# Patient Record
Sex: Female | Born: 1960 | Race: Black or African American | Hispanic: No | Marital: Single | Smoking: Never smoker
Health system: Southern US, Community
[De-identification: ages and names within clinical notes are randomized; demographics above are authoritative.]

## PROBLEM LIST (undated history)

## (undated) DIAGNOSIS — G4730 Sleep apnea, unspecified: Secondary | ICD-10-CM

## (undated) DIAGNOSIS — E7800 Pure hypercholesterolemia, unspecified: Secondary | ICD-10-CM

## (undated) DIAGNOSIS — M549 Dorsalgia, unspecified: Secondary | ICD-10-CM

## (undated) DIAGNOSIS — IMO0001 Reserved for inherently not codable concepts without codable children: Secondary | ICD-10-CM

## (undated) DIAGNOSIS — R112 Nausea with vomiting, unspecified: Secondary | ICD-10-CM

## (undated) DIAGNOSIS — K219 Gastro-esophageal reflux disease without esophagitis: Secondary | ICD-10-CM

## (undated) DIAGNOSIS — R42 Dizziness and giddiness: Secondary | ICD-10-CM

## (undated) DIAGNOSIS — F329 Major depressive disorder, single episode, unspecified: Secondary | ICD-10-CM

## (undated) DIAGNOSIS — R519 Headache, unspecified: Secondary | ICD-10-CM

## (undated) DIAGNOSIS — R51 Headache: Secondary | ICD-10-CM

## (undated) DIAGNOSIS — I8290 Acute embolism and thrombosis of unspecified vein: Secondary | ICD-10-CM

## (undated) DIAGNOSIS — Z9889 Other specified postprocedural states: Secondary | ICD-10-CM

## (undated) DIAGNOSIS — I10 Essential (primary) hypertension: Secondary | ICD-10-CM

## (undated) DIAGNOSIS — I739 Peripheral vascular disease, unspecified: Secondary | ICD-10-CM

## (undated) DIAGNOSIS — M519 Unspecified thoracic, thoracolumbar and lumbosacral intervertebral disc disorder: Secondary | ICD-10-CM

## (undated) DIAGNOSIS — G8929 Other chronic pain: Secondary | ICD-10-CM

## (undated) DIAGNOSIS — F32A Depression, unspecified: Secondary | ICD-10-CM

## (undated) HISTORY — PX: CHOLECYSTECTOMY: SHX55

## (undated) HISTORY — PX: DILATION AND CURETTAGE OF UTERUS: SHX78

---

## 1997-07-05 ENCOUNTER — Inpatient Hospital Stay (HOSPITAL_COMMUNITY): Admission: AD | Admit: 1997-07-05 | Discharge: 1997-07-05 | Payer: Self-pay | Admitting: Obstetrics

## 2000-06-05 ENCOUNTER — Encounter: Payer: Self-pay | Admitting: Obstetrics

## 2000-06-05 ENCOUNTER — Ambulatory Visit (HOSPITAL_COMMUNITY): Admission: RE | Admit: 2000-06-05 | Discharge: 2000-06-05 | Payer: Self-pay | Admitting: Obstetrics

## 2003-02-18 ENCOUNTER — Other Ambulatory Visit: Admission: RE | Admit: 2003-02-18 | Discharge: 2003-02-18 | Payer: Self-pay | Admitting: Family Medicine

## 2003-03-01 ENCOUNTER — Encounter: Admission: RE | Admit: 2003-03-01 | Discharge: 2003-03-01 | Payer: Self-pay | Admitting: Family Medicine

## 2004-01-18 ENCOUNTER — Ambulatory Visit: Payer: Self-pay | Admitting: Internal Medicine

## 2004-01-18 ENCOUNTER — Ambulatory Visit (HOSPITAL_BASED_OUTPATIENT_CLINIC_OR_DEPARTMENT_OTHER): Admission: RE | Admit: 2004-01-18 | Discharge: 2004-01-18 | Payer: Self-pay | Admitting: Family Medicine

## 2005-04-20 ENCOUNTER — Ambulatory Visit (HOSPITAL_COMMUNITY): Admission: RE | Admit: 2005-04-20 | Discharge: 2005-04-20 | Payer: Self-pay | Admitting: Obstetrics

## 2005-06-15 ENCOUNTER — Emergency Department (HOSPITAL_COMMUNITY): Admission: EM | Admit: 2005-06-15 | Discharge: 2005-06-16 | Payer: Self-pay | Admitting: Emergency Medicine

## 2005-06-16 ENCOUNTER — Ambulatory Visit (HOSPITAL_COMMUNITY): Admission: RE | Admit: 2005-06-16 | Discharge: 2005-06-16 | Payer: Self-pay | Admitting: *Deleted

## 2005-06-16 ENCOUNTER — Encounter: Payer: Self-pay | Admitting: Vascular Surgery

## 2005-06-22 ENCOUNTER — Ambulatory Visit (HOSPITAL_COMMUNITY): Admission: RE | Admit: 2005-06-22 | Discharge: 2005-06-22 | Payer: Self-pay | Admitting: Orthopedic Surgery

## 2006-07-25 ENCOUNTER — Ambulatory Visit (HOSPITAL_COMMUNITY): Admission: RE | Admit: 2006-07-25 | Discharge: 2006-07-25 | Payer: Self-pay | Admitting: Family Medicine

## 2007-06-06 ENCOUNTER — Encounter: Admission: RE | Admit: 2007-06-06 | Discharge: 2007-06-06 | Payer: Self-pay | Admitting: Family Medicine

## 2007-09-05 ENCOUNTER — Encounter: Admission: RE | Admit: 2007-09-05 | Discharge: 2007-09-05 | Payer: Self-pay | Admitting: Orthopedic Surgery

## 2007-11-04 ENCOUNTER — Encounter: Admission: RE | Admit: 2007-11-04 | Discharge: 2007-11-04 | Payer: Self-pay | Admitting: Orthopedic Surgery

## 2008-12-16 ENCOUNTER — Encounter: Admission: RE | Admit: 2008-12-16 | Discharge: 2008-12-16 | Payer: Self-pay | Admitting: Family Medicine

## 2009-03-31 ENCOUNTER — Encounter: Admission: RE | Admit: 2009-03-31 | Discharge: 2009-03-31 | Payer: Self-pay | Admitting: Family Medicine

## 2009-04-01 ENCOUNTER — Encounter: Admission: RE | Admit: 2009-04-01 | Discharge: 2009-04-01 | Payer: Self-pay | Admitting: Family Medicine

## 2010-02-05 ENCOUNTER — Encounter: Payer: Self-pay | Admitting: Obstetrics

## 2010-04-24 ENCOUNTER — Other Ambulatory Visit: Payer: Self-pay | Admitting: Family Medicine

## 2010-04-24 DIAGNOSIS — Z1231 Encounter for screening mammogram for malignant neoplasm of breast: Secondary | ICD-10-CM

## 2010-04-26 ENCOUNTER — Ambulatory Visit
Admission: RE | Admit: 2010-04-26 | Discharge: 2010-04-26 | Disposition: A | Discharge status: Home / Self Care | Payer: PRIVATE HEALTH INSURANCE | Source: Ambulatory Visit | Attending: Family Medicine | Admitting: Family Medicine

## 2010-04-26 DIAGNOSIS — Z1231 Encounter for screening mammogram for malignant neoplasm of breast: Secondary | ICD-10-CM

## 2010-06-02 NOTE — Procedures (Signed)
Kathy Houston, [place]              ACCOUNT NO.:  [id number]   MEDICAL RECORD NO.:  [id number]          PATIENT TYPE:  OUT   LOCATION:  SLEEP CENTER                 FACILITY:  [facility]   PHYSICIAN:  Clinton D. Annamaria Boots, M.D. DATE OF BIRTH:  1960-03-04   DATE OF STUDY:                              NOCTURNAL POLYSOMNOGRAM   STUDY DATE:  01/18/04   REFERRING PHYSICIAN:  Lucianne Lei, M.D.   INDICATION FOR STUDY:  Hypersomnia with sleep apnea.   EPWORTH SLEEPINESS SCORE:  15/24   SLEEP ARCHITECTURE:  Short total sleep time with sleep onset at 5:21 p.m.,  lights on at 11:32 p.m.  The schedule is apparently arranged to fit the  patient's usual sleep schedule although the patient provided little history.  Sleep efficiency was 40%.  Stage I 11% stage II 57%, stages III and IV 24%.  REM was 8% of total sleep time.  Sleep latency 10 minutes.  REM latency 41  minutes.  Awake after sleep onset 219 minutes.  Arousal index 21.  The  patient was noted talking on the telephone 2 hours into the sleep study and  was subsequent awake and restless most of the rest of the recording time.  No sleep medications were taken.  She used the television as a sleep aid.   Respiratory disturbance index (RDI) 20 per hour, indicating moderate  obstructive sleep apnea/hypopnea syndrome.  This reflected 3 obstructive  apneas and 47 hypopnea's.  Events were not positional.  REM RDI was 67 per  hour.  CPAP titration could not be performed on this study because of  insufficient sleep.   OXYGEN DATA:  Moderate snoring with oxygen desaturation to a nadir of 75%.  Mean oxygen saturations with the study was 96% on room air.   CARDIAC DATA:  Normal sinus rhythm   MOVEMENTS/PARASOMNIA:  Occasional leg jerk.  Bathroom x2.   IMPRESSION/RECOMMENDATION:  1.  Nonstandard sleep schedule, possibly reflecting job requirement, but      complicated by difficulty maintaining sleep and use of the telephone      during the sleep time.   These suggest poor sleep hygiene and invite      educational discussion with the patient about good sleep habits,      protected sleep environment and realistic expectations.  The insomnia      component may benefit substantially from appropriate use a sleep      medication.  2.  There is moderate sleep apnea/hypopnea with an RDI of 20 obstructive      events per hour and oxygen      desaturation to 75%.  Suggest return for CPAP titration bringing a sleep      medication, or evaluate for alternative therapies as appropriate.                                                           Clinton D. Annamaria Boots, M.D.  Diplomate, Tax adviser   CDY/MEDQ  D:  01/23/2004 10:09:48  T:  01/23/2004 12:23:47  Job:  [id number]

## 2010-09-27 ENCOUNTER — Other Ambulatory Visit: Payer: Self-pay | Admitting: Family Medicine

## 2010-09-27 ENCOUNTER — Ambulatory Visit
Admission: RE | Admit: 2010-09-27 | Discharge: 2010-09-27 | Disposition: A | Discharge status: Home / Self Care | Payer: PRIVATE HEALTH INSURANCE | Source: Ambulatory Visit | Attending: Family Medicine | Admitting: Family Medicine

## 2010-09-27 DIAGNOSIS — G8929 Other chronic pain: Secondary | ICD-10-CM

## 2011-07-30 DIAGNOSIS — I10 Essential (primary) hypertension: Secondary | ICD-10-CM | POA: Insufficient documentation

## 2011-07-30 DIAGNOSIS — E7800 Pure hypercholesterolemia, unspecified: Secondary | ICD-10-CM | POA: Insufficient documentation

## 2011-07-30 DIAGNOSIS — R42 Dizziness and giddiness: Secondary | ICD-10-CM | POA: Insufficient documentation

## 2011-07-30 DIAGNOSIS — G8929 Other chronic pain: Secondary | ICD-10-CM | POA: Insufficient documentation

## 2011-07-30 DIAGNOSIS — Z880 Allergy status to penicillin: Secondary | ICD-10-CM | POA: Insufficient documentation

## 2011-07-30 DIAGNOSIS — E119 Type 2 diabetes mellitus without complications: Secondary | ICD-10-CM | POA: Insufficient documentation

## 2011-07-30 DIAGNOSIS — M549 Dorsalgia, unspecified: Secondary | ICD-10-CM | POA: Insufficient documentation

## 2011-07-30 NOTE — ED Notes (Signed)
Pt is being tx for lower back pain x 3 months.  She states the pain is beginning to radiating to bil pos thighs.  States hydrocodone and tramadol are not working.

## 2011-07-31 ENCOUNTER — Emergency Department (HOSPITAL_COMMUNITY)
Admission: EM | Admit: 2011-07-31 | Discharge: 2011-07-31 | Disposition: A | Discharge status: Home / Self Care | Payer: Self-pay | Attending: Emergency Medicine | Admitting: Emergency Medicine

## 2011-07-31 ENCOUNTER — Encounter (HOSPITAL_COMMUNITY): Payer: Self-pay | Admitting: *Deleted

## 2011-07-31 DIAGNOSIS — M549 Dorsalgia, unspecified: Secondary | ICD-10-CM

## 2011-07-31 HISTORY — DX: Dizziness and giddiness: R42

## 2011-07-31 HISTORY — DX: Other chronic pain: G89.29

## 2011-07-31 HISTORY — DX: Essential (primary) hypertension: I10

## 2011-07-31 HISTORY — DX: Pure hypercholesterolemia, unspecified: E78.00

## 2011-07-31 HISTORY — DX: Dorsalgia, unspecified: M54.9

## 2011-07-31 MED ORDER — DIAZEPAM 5 MG PO TABS: 5.0000 mg | ORAL_TABLET | Freq: Three times a day (TID) | ORAL | Status: AC | PRN

## 2011-07-31 MED ORDER — OXYCODONE-ACETAMINOPHEN 5-325 MG PO TABS
1.0000 | ORAL_TABLET | Freq: Once | ORAL | Status: AC
  Administered 2011-07-31: 1 via ORAL
  Filled 2011-07-31: qty 1

## 2011-07-31 MED ORDER — PERCOCET 5-325 MG PO TABS: 1.0000 | ORAL_TABLET | ORAL | Status: AC | PRN

## 2011-07-31 NOTE — ED Provider Notes (Signed)
History     CSN: [id number]  Arrival date & time 07/30/11  2340   First MD Initiated Contact with Patient 07/31/11 0200      Chief Complaint  Patient presents with  . Back Pain    (Consider location/radiation/quality/duration/timing/severity/associated sxs/prior treatment) HPI Comments: Patient is a 51 year-old female with a history of neuropathy and chronic back pain. States that her lower back pain has been getting progressively worse over the past three months.  Notes it radiates down the back of her legs. The pain is constant, sharp and a 10 out of 10 on the pain scale.  Pain is exacerbated by standing and walking, worse first thing in the morning. She has not had any relief from hydrocodone, tramadol, and ibuprofen. She notes numbness and tingling on the bottom of her feet. Denies any weakness in her legs.  She denies any trauma or surgeries in her back and lower extremities.  She denies loss of bowel or bladder function. She denies abdominal pain, urinary symptoms.  Pt is cared for for her chronic back pain by Dr Berdine Addison (PCP).  Pt has had xrays of her back previously that she states "did not show anything."   Patient is a 51 y.o. female presenting with back pain. The history is provided by the patient.  Back Pain  Pertinent negatives include no fever, no abdominal pain, no dysuria and no weakness.    Past Medical History  Diagnosis Date  . Hypertension   . Diabetes mellitus   . Chronic back pain   . Hypercholesteremia   . Vertigo     Past Surgical History  Procedure Date  . Cholecystectomy     No family history on file.  History  Substance Use Topics  . Smoking status: Never Smoker   . Smokeless tobacco: Not on file  . Alcohol Use: No    OB History    Grav Para Term Preterm Abortions TAB SAB Ect Mult Living                  Review of Systems  Constitutional: Negative for fever and chills.  Gastrointestinal: Negative for abdominal pain.  Genitourinary:  Negative for dysuria, urgency and frequency.  Musculoskeletal: Positive for back pain.  Neurological: Negative for weakness.    Allergies  Penicillins  Home Medications   Current Outpatient Rx  Name Route Sig Dispense Refill  . HYDROCHLOROTHIAZIDE 12.5 MG PO CAPS Oral Take 12.5 mg by mouth daily.    Marland Kitchen HYDROCODONE-ACETAMINOPHEN 5-500 MG PO TABS Oral Take 1 tablet by mouth every 6 (six) hours as needed. For pain    . IBUPROFEN 200 MG PO TABS Oral Take 800 mg by mouth every 6 (six) hours as needed. For pain    . BONINE PO Oral Take 1 tablet by mouth.    . METFORMIN HCL 500 MG PO TABS Oral Take 250 mg by mouth daily with breakfast.    . PRAVASTATIN SODIUM 20 MG PO TABS Oral Take 20 mg by mouth daily.    . TRAMADOL HCL 50 MG PO TABS Oral Take 50 mg by mouth every 6 (six) hours as needed. For pain      BP 135/77  Pulse 83  Temp 98.4 F (36.9 C) (Oral)  Resp 18  SpO2 99%  Physical Exam  Nursing note and vitals reviewed. Constitutional: She is oriented to person, place, and time. She appears well-developed and well-nourished. No distress.  HENT:  Head: Normocephalic and atraumatic.  Neck: Neck  supple.  Pulmonary/Chest: Effort normal.  Musculoskeletal:       Cervical back: Normal.       Thoracic back: Normal.       Lumbar back: She exhibits bony tenderness.       Low back with diffuse bony tenderness. No crepitus or step-offs.  No skin changes.    Lower extremities: strength 5/5, sensation intact, distal pulses intact.  Pt ambulates with pain, no e/o weakness.    Neurological: She is alert and oriented to person, place, and time.  Skin: She is not diaphoretic.    ED Course  Procedures (including critical care time)  Labs Reviewed - No data to display No results found.   1. Chronic back pain       MDM  Pt with chronic back pain, worsening over three months.  Home medications not helping anymore.  No new injury.  No neurological deficits.   Pt checked on [place]  showing regular vicodin prescriptions from PCP only, though none in past 2-3 months - pt disagrees with this and states she has an active prescription, showed me a bottle with vicodin still in it.   Pt d/c home with percocet, valium, PCP follow up.  Return precautions given.  Patient verbalizes understanding and agrees with plan.         Otis Brace, PA 07/31/11 [place], [place] 07/31/11 [phone]

## 2011-08-02 NOTE — ED Provider Notes (Signed)
Medical screening examination/treatment/procedure(s) were performed by non-physician practitioner and as supervising physician I was immediately available for consultation/collaboration.   Mervin Kung, MD 08/02/11 2154

## 2011-09-01 ENCOUNTER — Emergency Department (HOSPITAL_COMMUNITY)
Admission: EM | Admit: 2011-09-01 | Discharge: 2011-09-01 | Disposition: A | Discharge status: Home / Self Care | Payer: Worker's Compensation | Attending: Emergency Medicine | Admitting: Emergency Medicine

## 2011-09-01 ENCOUNTER — Encounter (HOSPITAL_COMMUNITY): Payer: Self-pay | Admitting: Emergency Medicine

## 2011-09-01 DIAGNOSIS — I10 Essential (primary) hypertension: Secondary | ICD-10-CM | POA: Insufficient documentation

## 2011-09-01 DIAGNOSIS — Z880 Allergy status to penicillin: Secondary | ICD-10-CM | POA: Insufficient documentation

## 2011-09-01 DIAGNOSIS — E119 Type 2 diabetes mellitus without complications: Secondary | ICD-10-CM | POA: Insufficient documentation

## 2011-09-01 DIAGNOSIS — G8929 Other chronic pain: Secondary | ICD-10-CM | POA: Insufficient documentation

## 2011-09-01 DIAGNOSIS — S2000XA Contusion of breast, unspecified breast, initial encounter: Secondary | ICD-10-CM | POA: Insufficient documentation

## 2011-09-01 DIAGNOSIS — T148XXA Other injury of unspecified body region, initial encounter: Secondary | ICD-10-CM

## 2011-09-01 DIAGNOSIS — Y9210 Unspecified residential institution as the place of occurrence of the external cause: Secondary | ICD-10-CM | POA: Insufficient documentation

## 2011-09-01 DIAGNOSIS — E7800 Pure hypercholesterolemia, unspecified: Secondary | ICD-10-CM | POA: Insufficient documentation

## 2011-09-01 DIAGNOSIS — M549 Dorsalgia, unspecified: Secondary | ICD-10-CM | POA: Insufficient documentation

## 2011-09-01 MED ORDER — IBUPROFEN 400 MG PO TABS
600.0000 mg | ORAL_TABLET | Freq: Once | ORAL | Status: AC
  Administered 2011-09-01: 600 mg via ORAL
  Filled 2011-09-01: qty 1
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
## 2011-09-02 NOTE — ED Provider Notes (Signed)
Medical screening examination/treatment/procedure(s) were performed by non-physician practitioner and as supervising physician I was immediately available for consultation/collaboration.   Mylinda Latina III, MD 09/02/11 [phone]

## 2012-01-14 DIAGNOSIS — R079 Chest pain, unspecified: Secondary | ICD-10-CM | POA: Insufficient documentation

## 2012-01-14 DIAGNOSIS — I10 Essential (primary) hypertension: Secondary | ICD-10-CM | POA: Insufficient documentation

## 2012-01-14 DIAGNOSIS — R209 Unspecified disturbances of skin sensation: Secondary | ICD-10-CM | POA: Insufficient documentation

## 2012-01-14 DIAGNOSIS — E119 Type 2 diabetes mellitus without complications: Secondary | ICD-10-CM | POA: Insufficient documentation

## 2012-01-14 DIAGNOSIS — Z79899 Other long term (current) drug therapy: Secondary | ICD-10-CM | POA: Insufficient documentation

## 2012-01-14 DIAGNOSIS — E7800 Pure hypercholesterolemia, unspecified: Secondary | ICD-10-CM | POA: Insufficient documentation

## 2012-01-14 DIAGNOSIS — G8929 Other chronic pain: Secondary | ICD-10-CM | POA: Insufficient documentation

## 2012-01-14 DIAGNOSIS — M25519 Pain in unspecified shoulder: Secondary | ICD-10-CM | POA: Insufficient documentation

## 2012-01-14 DIAGNOSIS — M549 Dorsalgia, unspecified: Secondary | ICD-10-CM | POA: Insufficient documentation

## 2012-01-14 DIAGNOSIS — R42 Dizziness and giddiness: Secondary | ICD-10-CM | POA: Insufficient documentation

## 2012-01-15 ENCOUNTER — Emergency Department (HOSPITAL_COMMUNITY): Payer: Self-pay

## 2012-01-15 ENCOUNTER — Encounter (HOSPITAL_COMMUNITY): Payer: Self-pay | Admitting: Emergency Medicine

## 2012-01-15 ENCOUNTER — Emergency Department (HOSPITAL_COMMUNITY)
Admission: EM | Admit: 2012-01-15 | Discharge: 2012-01-15 | Disposition: A | Discharge status: Home / Self Care | Payer: Self-pay | Attending: Emergency Medicine | Admitting: Emergency Medicine

## 2012-01-15 DIAGNOSIS — R079 Chest pain, unspecified: Secondary | ICD-10-CM

## 2012-01-15 LAB — COMPREHENSIVE METABOLIC PANEL
ALT: 18 U/L (ref 0–35)
Albumin: 3.5 g/dL (ref 3.5–5.2)
Alkaline Phosphatase: 64 U/L (ref 39–117)
BUN: 9 mg/dL (ref 6–23)
Chloride: 100 mEq/L (ref 96–112)
Potassium: 3.4 mEq/L — ABNORMAL LOW (ref 3.5–5.1)
Sodium: 139 mEq/L (ref 135–145)
Total Bilirubin: 0.3 mg/dL (ref 0.3–1.2)
Total Protein: 7.6 g/dL (ref 6.0–8.3)

## 2012-01-15 LAB — CBC WITH DIFFERENTIAL/PLATELET
Basophils Relative: 0 % (ref 0–1)
Eosinophils Absolute: 0.2 10*3/uL (ref 0.0–0.7)
Hemoglobin: 12.4 g/dL (ref 12.0–15.0)
Lymphs Abs: 3.5 10*3/uL (ref 0.7–4.0)
MCH: 30.7 pg (ref 26.0–34.0)
MCHC: 33.2 g/dL (ref 30.0–36.0)
Monocytes Relative: 6 % (ref 3–12)
Neutro Abs: 4.6 10*3/uL (ref 1.7–7.7)
Neutrophils Relative %: 51 % (ref 43–77)
Platelets: 233 10*3/uL (ref 150–400)
RBC: 4.04 MIL/uL (ref 3.87–5.11)

## 2012-01-15 MED ORDER — NITROGLYCERIN 0.4 MG SL SUBL
0.4000 mg | SUBLINGUAL_TABLET | SUBLINGUAL | Status: DC | PRN
  Administered 2012-01-15 (×2): 0.4 mg via SUBLINGUAL
  Filled 2012-01-15: qty 25

## 2012-01-15 MED ORDER — MORPHINE SULFATE 4 MG/ML IJ SOLN
2.0000 mg | Freq: Once | INTRAMUSCULAR | Status: AC
  Administered 2012-01-15: 2 mg via INTRAVENOUS
  Filled 2012-01-15: qty 1

## 2012-01-15 MED ORDER — ASPIRIN 81 MG PO CHEW
324.0000 mg | CHEWABLE_TABLET | Freq: Once | ORAL | Status: AC
Start: 2012-01-15 — End: 2012-01-15
  Administered 2012-01-15: 324 mg via ORAL
  Filled 2012-01-15: qty 4

## 2012-01-15 MED ORDER — ONDANSETRON HCL 4 MG PO TABS: 4.0000 mg | ORAL_TABLET | Freq: Four times a day (QID) | ORAL | Status: DC

## 2012-01-15 NOTE — ED Notes (Signed)
Pain down to 4/10 after 2nd nitro, Dr Olin Hauser aware BP 150/86

## 2012-01-15 NOTE — ED Provider Notes (Signed)
History     CSN: [id number]  Arrival date & time 01/14/12  2353   First MD Initiated Contact with Patient 01/15/12 0015      Chief Complaint  Patient presents with  . Chest Pain    (Consider location/radiation/quality/duration/timing/severity/associated sxs/prior treatment) HPI Kathy Houston is a 51 y.o. female who presents to the Emergency Department complaining of chest pain to the left side of her chest with radiation to the left shoulder and shoulder blade area that began while she was at work. She felt a little dizzy with the pain. Denies diaphoresis, nausea, vomiting, shortness of breath. She has taken no medicines. She also reports that since arrival in the ER she has been experiencing some numbness to her right arm and hand.  PCP Dr. Berdine Addison Past Medical History  Diagnosis Date  . Hypertension   . Diabetes mellitus   . Chronic back pain   . Hypercholesteremia   . Vertigo     Past Surgical History  Procedure Date  . Cholecystectomy     History reviewed. No pertinent family history.  History  Substance Use Topics  . Smoking status: Never Smoker   . Smokeless tobacco: Not on file  . Alcohol Use: No    OB History    Grav Para Term Preterm Abortions TAB SAB Ect Mult Living                  Review of Systems  Constitutional: Negative for fever.       10 Systems reviewed and are negative for acute change except as noted in the HPI.  HENT: Negative for congestion.   Eyes: Negative for discharge and redness.  Respiratory: Negative for cough and shortness of breath.   Cardiovascular: Positive for chest pain.  Gastrointestinal: Negative for vomiting and abdominal pain.  Musculoskeletal: Negative for back pain.  Skin: Negative for rash.  Neurological: Positive for numbness. Negative for syncope and headaches.  Psychiatric/Behavioral:       No behavior change.    Allergies  Penicillins  Home Medications   Current Outpatient Rx  Name  Route  Sig  Dispense   Refill  . HYDROCHLOROTHIAZIDE 12.5 MG PO CAPS   Oral   Take 12.5 mg by mouth daily.         . IBUPROFEN 200 MG PO TABS   Oral   Take 800 mg by mouth every 6 (six) hours as needed. For pain         . BONINE PO   Oral   Take 1 tablet by mouth.         . METFORMIN HCL 500 MG PO TABS   Oral   Take 250 mg by mouth daily with breakfast.         . PRAVASTATIN SODIUM 20 MG PO TABS   Oral   Take 20 mg by mouth daily.         . TRAMADOL HCL 50 MG PO TABS   Oral   Take 50 mg by mouth every 6 (six) hours as needed. For pain           BP 195/100  Pulse 86  Temp 98.5 F (36.9 C) (Oral)  Resp 18  Ht 5\' 4"  (1.626 m)  Wt 297 lb (134.718 kg)  BMI 50.98 kg/m2  SpO2 97%  Physical Exam  Nursing note and vitals reviewed. Constitutional: She is oriented to person, place, and time.       Awake, alert, nontoxic appearance.  HENT:  Head: Atraumatic.  Eyes: Right eye exhibits no discharge. Left eye exhibits no discharge.  Neck: Neck supple.  Cardiovascular: Normal rate and normal heart sounds.   Pulmonary/Chest: Effort normal and breath sounds normal. She exhibits no tenderness.  Abdominal: Soft. There is no tenderness. There is no rebound.  Musculoskeletal: She exhibits no tenderness.       Baseline ROM, no obvious new focal weakness.  Neurological: She is alert and oriented to person, place, and time. She has normal reflexes. No cranial nerve deficit.       Mental status and motor strength appears baseline for patient and situation.  Skin: No rash noted.  Psychiatric: She has a normal mood and affect.    ED Course  Procedures (including critical care time) Results for orders placed during the hospital encounter of 01/15/12  CBC WITH DIFFERENTIAL      Component Value Range   WBC 8.9  4.0 - 10.5 K/uL   RBC 4.04  3.87 - 5.11 MIL/uL   Hemoglobin 12.4  12.0 - 15.0 g/dL   HCT 37.3  36.0 - 46.0 %   MCV 92.3  78.0 - 100.0 fL   MCH 30.7  26.0 - 34.0 pg   MCHC 33.2  30.0  - 36.0 g/dL   RDW 14.2  11.5 - 15.5 %   Platelets 233  150 - 400 K/uL   Neutrophils Relative 51  43 - 77 %   Neutro Abs 4.6  1.7 - 7.7 K/uL   Lymphocytes Relative 40  12 - 46 %   Lymphs Abs 3.5  0.7 - 4.0 K/uL   Monocytes Relative 6  3 - 12 %   Monocytes Absolute 0.6  0.1 - 1.0 K/uL   Eosinophils Relative 3  0 - 5 %   Eosinophils Absolute 0.2  0.0 - 0.7 K/uL   Basophils Relative 0  0 - 1 %   Basophils Absolute 0.0  0.0 - 0.1 K/uL  COMPREHENSIVE METABOLIC PANEL      Component Value Range   Sodium 139  135 - 145 mEq/L   Potassium 3.4 (*) 3.5 - 5.1 mEq/L   Chloride 100  96 - 112 mEq/L   CO2 31  19 - 32 mEq/L   Glucose, Bld 150 (*) 70 - 99 mg/dL   BUN 9  6 - 23 mg/dL   Creatinine, Ser 1.14 (*) 0.50 - 1.10 mg/dL   Calcium 9.0  8.4 - 10.5 mg/dL   Total Protein 7.6  6.0 - 8.3 g/dL   Albumin 3.5  3.5 - 5.2 g/dL   AST 23  0 - 37 U/L   ALT 18  0 - 35 U/L   Alkaline Phosphatase 64  39 - 117 U/L   Total Bilirubin 0.3  0.3 - 1.2 mg/dL   GFR calc non Af Amer 55 (*) >90 mL/min   GFR calc Af Amer 63 (*) >90 mL/min  TROPONIN I      Component Value Range   Troponin I <0.30  <0.30 ng/mL    Date: 01/15/2012  0006  Rate: 86  Rhythm: normal sinus rhythm  QRS Axis: normal  Intervals: normal  ST/T Wave abnormalities: normal  Conduction Disutrbances:none  Narrative Interpretation: left ventricular hypertrophy  Old EKG Reviewed: unchanged c/w 06/22/05  Dg Chest Portable 1 View  01/15/2012  *RADIOLOGY REPORT*  Clinical Data: Left-sided chest pain, radiating into the left arm and shoulder; left arm numbness.  PORTABLE CHEST - 1 VIEW  Comparison: Thoracic spine radiographs performed  12/16/2008  Findings: The lungs are well-aerated and clear.  There is no evidence of focal opacification, pleural effusion or pneumothorax. There is mild elevation of the right hemidiaphragm.  The cardiomediastinal silhouette is borderline normal in size.  No acute osseous abnormalities are seen.  IMPRESSION: Mild  elevation of the right hemidiaphragm; no acute cardiopulmonary process seen.   Original Report Authenticated By: Santa Lighter, M.D.    [phone] Patient received ASA and NTG DL x 2. Pain eased to a 4/10. 0230 Given morphine 2 mg with relief of chest discomfort.   MDM  Patient presents with Chest pain with radiation to the left shoulder and back. Also with right upper extremity numbness. Labs unremarkable. Troponin negative. EKG and chest xray normal. Reviewed results with patient. Patient feels better after ASA, NTG and morphine. Patient is not at risk for ACS. Doubt pain was cardiac. 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 Pt feels improved after observation and/or treatment in ED.Pt stable in ED with no significant deterioration in condition.The patient appears reasonably screened and/or stabilized for discharge and I doubt any other medical condition or other [facility] requiring further screening, evaluation, or treatment in the ED at this time prior to discharge.  MDM Reviewed: nursing note and vitals Interpretation: labs, ECG and x-ray  CRITICAL CARE Performed by: Prentiss Bells. Total critical care time: 40 Critical care time was exclusive of separately billable procedures and treating other patients. Critical care was necessary to treat or prevent imminent or life-threatening deterioration. Critical care was time spent personally by me on the following activities: development of treatment plan with patient and/or surrogate as well as nursing, discussions with consultants, evaluation of patient's response to treatment, examination of patient, obtaining history from patient or surrogate, ordering and performing treatments and interventions, ordering and review of laboratory studies, ordering and review of radiographic studies, pulse oximetry and re-evaluation of patient's condition.         Gypsy Balsam. Olin Hauser, MD 01/15/12 [phone]

## 2012-01-15 NOTE — ED Notes (Signed)
Pain 6/10 down from 8/10. Will give 2nd nitro. BO 171/86

## 2012-01-15 NOTE — ED Notes (Addendum)
Patient complaining of left sided chest pain radiating into left arm and shoulder with left arm numbness starting approximately 3 hours ago while she was at work. States she is not taking her blood pressure medication due to not being able to afford it.

## 2012-01-15 NOTE — ED Notes (Signed)
Pt complains of chest pain over last couple of hours, states it began at work, pain is in left chest and radiates to left shoulder and right shoulder. States the pain is moving across the back and alternates from shoulder to shoulder. Pt aax4, NSR on monitor, IV started, labs ordered, pt on monitor, and EKG has been done.

## 2012-04-17 ENCOUNTER — Encounter (HOSPITAL_COMMUNITY): Payer: Self-pay

## 2012-04-17 ENCOUNTER — Emergency Department (HOSPITAL_COMMUNITY): Payer: BC Managed Care – PPO

## 2012-04-17 ENCOUNTER — Emergency Department (HOSPITAL_COMMUNITY)
Admission: EM | Admit: 2012-04-17 | Discharge: 2012-04-17 | Disposition: A | Discharge status: Home / Self Care | Payer: BC Managed Care – PPO | Attending: Emergency Medicine | Admitting: Emergency Medicine

## 2012-04-17 DIAGNOSIS — Z86718 Personal history of other venous thrombosis and embolism: Secondary | ICD-10-CM | POA: Insufficient documentation

## 2012-04-17 DIAGNOSIS — I10 Essential (primary) hypertension: Secondary | ICD-10-CM | POA: Insufficient documentation

## 2012-04-17 DIAGNOSIS — E119 Type 2 diabetes mellitus without complications: Secondary | ICD-10-CM | POA: Insufficient documentation

## 2012-04-17 DIAGNOSIS — Z79899 Other long term (current) drug therapy: Secondary | ICD-10-CM | POA: Insufficient documentation

## 2012-04-17 DIAGNOSIS — M549 Dorsalgia, unspecified: Secondary | ICD-10-CM | POA: Insufficient documentation

## 2012-04-17 DIAGNOSIS — R748 Abnormal levels of other serum enzymes: Secondary | ICD-10-CM

## 2012-04-17 DIAGNOSIS — E7800 Pure hypercholesterolemia, unspecified: Secondary | ICD-10-CM | POA: Insufficient documentation

## 2012-04-17 DIAGNOSIS — R609 Edema, unspecified: Secondary | ICD-10-CM | POA: Insufficient documentation

## 2012-04-17 DIAGNOSIS — R5383 Other fatigue: Secondary | ICD-10-CM | POA: Insufficient documentation

## 2012-04-17 DIAGNOSIS — R202 Paresthesia of skin: Secondary | ICD-10-CM

## 2012-04-17 DIAGNOSIS — Z8669 Personal history of other diseases of the nervous system and sense organs: Secondary | ICD-10-CM | POA: Insufficient documentation

## 2012-04-17 DIAGNOSIS — G8929 Other chronic pain: Secondary | ICD-10-CM | POA: Insufficient documentation

## 2012-04-17 DIAGNOSIS — E669 Obesity, unspecified: Secondary | ICD-10-CM | POA: Insufficient documentation

## 2012-04-17 DIAGNOSIS — N390 Urinary tract infection, site not specified: Secondary | ICD-10-CM | POA: Insufficient documentation

## 2012-04-17 DIAGNOSIS — R5381 Other malaise: Secondary | ICD-10-CM | POA: Insufficient documentation

## 2012-04-17 DIAGNOSIS — R209 Unspecified disturbances of skin sensation: Secondary | ICD-10-CM | POA: Insufficient documentation

## 2012-04-17 HISTORY — DX: Acute embolism and thrombosis of unspecified vein: I82.90

## 2012-04-17 LAB — CBC WITH DIFFERENTIAL/PLATELET
Basophils Absolute: 0 10*3/uL (ref 0.0–0.1)
Basophils Relative: 0 % (ref 0–1)
Eosinophils Absolute: 0.3 10*3/uL (ref 0.0–0.7)
Eosinophils Relative: 5 % (ref 0–5)
HCT: 37.1 % (ref 36.0–46.0)
Hemoglobin: 12.1 g/dL (ref 12.0–15.0)
MCH: 30.3 pg (ref 26.0–34.0)
MCHC: 32.6 g/dL (ref 30.0–36.0)
MCV: 92.8 fL (ref 78.0–100.0)
Monocytes Absolute: 0.4 10*3/uL (ref 0.1–1.0)
Monocytes Relative: 7 % (ref 3–12)
RDW: 14.5 % (ref 11.5–15.5)

## 2012-04-17 LAB — URINALYSIS, ROUTINE W REFLEX MICROSCOPIC
Ketones, ur: NEGATIVE mg/dL
Nitrite: NEGATIVE
Protein, ur: NEGATIVE mg/dL
pH: 6 (ref 5.0–8.0)

## 2012-04-17 LAB — COMPREHENSIVE METABOLIC PANEL
Albumin: 3.7 g/dL (ref 3.5–5.2)
BUN: 7 mg/dL (ref 6–23)
Calcium: 9.3 mg/dL (ref 8.4–10.5)
Chloride: 104 mEq/L (ref 96–112)
Creatinine, Ser: 1 mg/dL (ref 0.50–1.10)
Total Bilirubin: 0.4 mg/dL (ref 0.3–1.2)

## 2012-04-17 LAB — URINE MICROSCOPIC-ADD ON

## 2012-04-17 LAB — CK: Total CK: 674 U/L — ABNORMAL HIGH (ref 7–177)

## 2012-04-17 MED ORDER — CEPHALEXIN 250 MG/5ML PO SUSR: 250.0000 mg | Freq: Once | ORAL | Status: DC

## 2012-04-17 MED ORDER — CEPHALEXIN 250 MG/5ML PO SUSR
ORAL | Status: AC
  Filled 2012-04-17: qty 20

## 2012-04-17 MED ORDER — CEPHALEXIN 500 MG PO CAPS: 500.0000 mg | ORAL_CAPSULE | Freq: Four times a day (QID) | ORAL | Status: DC

## 2012-04-17 MED ORDER — CEPHALEXIN 250 MG/5ML PO SUSR
500.0000 mg | Freq: Once | ORAL | Status: AC
  Administered 2012-04-17: 500 mg via ORAL

## 2012-04-17 MED ORDER — FUROSEMIDE 40 MG PO TABS: 40.0000 mg | ORAL_TABLET | Freq: Every day | ORAL | Status: DC

## 2012-04-17 MED ORDER — CEPHALEXIN 500 MG PO CAPS
500.0000 mg | ORAL_CAPSULE | Freq: Once | ORAL | Status: DC
  Filled 2012-04-17: qty 1

## 2012-04-17 NOTE — ED Provider Notes (Signed)
History     This chart was scribed for Delora Fuel, MD, MD by Rhae Lerner, ED Scribe. The patient was seen in room APA06/APA06 and the patient's care was started at 9:38 AM.   CSN: [id number]  Arrival date & time 04/17/12  0921      No chief complaint on file.    The history is provided by the patient and medical records. No language interpreter was used.   Kathy Houston is a 52 y.o. female with h/o HTN, DM, DVT, chronic back pain and hypercholesteremia who presents to the Emergency Department complaining of worsening, intermittent, moderate, numbness of bilateral lower extremities starting at hip onset 1 week ago. The numbness in on posterior and anterior bilateral legs. She reports that she weakness on right leg because her leg is giving out. She mentions having trouble walking up stairs. Numbness increases when walking intermittently. Pt reports that she noticed swelling in bilateral legs. She reports that her left hand has numbness that has been ongoing for past month. She reports left hand numbness is worsening. She denies weakness in right arm. She reports hx of similar symptoms when she had DVT. Her last DVT was 3 years ago. Pt denies fever, chills, nausea, vomiting, diarrhea, cough, SOB and any other pain.  PCP is Dr. Berdine Addison   Past Medical History  Diagnosis Date  . Hypertension   . Diabetes mellitus   . Chronic back pain   . Hypercholesteremia   . Vertigo     Past Surgical History  Procedure Laterality Date  . Cholecystectomy      No family history on file.  History  Substance Use Topics  . Smoking status: Never Smoker   . Smokeless tobacco: Not on file  . Alcohol Use: No    OB History   Grav Para Term Preterm Abortions TAB SAB Ect Mult Living                  Review of Systems  All other systems reviewed and are negative.    Allergies  Penicillins  Home Medications   Current Outpatient Rx  Name  Route  Sig  Dispense  Refill  . hydrochlorothiazide  (MICROZIDE) 12.5 MG capsule   Oral   Take 12.5 mg by mouth daily.         Marland Kitchen ibuprofen (ADVIL,MOTRIN) 200 MG tablet   Oral   Take 800 mg by mouth every 6 (six) hours as needed. For pain         . Meclizine HCl (BONINE PO)   Oral   Take 1 tablet by mouth.         . metFORMIN (GLUCOPHAGE) 500 MG tablet   Oral   Take 250 mg by mouth daily with breakfast.         . ondansetron (ZOFRAN) 4 MG tablet   Oral   Take 1 tablet (4 mg total) by mouth every 6 (six) hours.   12 tablet   0   . pravastatin (PRAVACHOL) 20 MG tablet   Oral   Take 20 mg by mouth daily.         . traMADol (ULTRAM) 50 MG tablet   Oral   Take 50 mg by mouth every 6 (six) hours as needed. For pain           BP 131/56  Pulse 57  Temp(Src) 97.8 F (36.6 C) (Oral)  Resp 20  Ht 5\' 4"  (1.626 m)  Wt 290 lb (131.543 kg)  BMI 49.75 kg/m2  SpO2 100%  Physical Exam  Nursing note and vitals reviewed. Constitutional: She is oriented to person, place, and time. She appears well-developed and well-nourished. No distress.  Obese   HENT:  Head: Normocephalic and atraumatic.  Eyes: EOM are normal. Pupils are equal, round, and reactive to light.  Neck: Normal range of motion. Neck supple. No tracheal deviation present.  Cardiovascular: Normal rate, regular rhythm and normal heart sounds.   Pulmonary/Chest: Effort normal. No respiratory distress. She has no wheezes. She has no rales.  Abdominal: Soft. Bowel sounds are normal. She exhibits no distension. There is no tenderness. There is no rebound.  Musculoskeletal: Normal range of motion.  Moderate tenderness on lumbar spine +3 pretibial and pedal edema No difference calf circumference   Neurological: She is alert and oriented to person, place, and time.  Decreased sensation in left hand  and both lower extremities No extinction on double simultaneous stimulation Weakness of hip flexion and knee flexion right leg with strength 4/5 Normal strength  bilateral of plantar flexion and dorsi flexion No pronator drift   Skin: Skin is warm and dry.  Psychiatric: She has a normal mood and affect. Her behavior is normal.    ED Course  Procedures (including critical care time) DIAGNOSTIC STUDIES: Oxygen Saturation is 100% on room air, normal by my interpretation.    COORDINATION OF CARE: 9:51 AM Discussed ED treatment with pt and pt agrees.     Labs Reviewed  CBC WITH DIFFERENTIAL - Abnormal; Notable for the following:    Neutrophils Relative 40 (*)    Lymphocytes Relative 48 (*)    All other components within normal limits  COMPREHENSIVE METABOLIC PANEL - Abnormal; Notable for the following:    Glucose, Bld 106 (*)    GFR calc non Af Amer 64 (*)    GFR calc Af Amer 74 (*)    All other components within normal limits  CK - Abnormal; Notable for the following:    Total CK 674 (*)    All other components within normal limits  URINALYSIS, ROUTINE W REFLEX MICROSCOPIC - Abnormal; Notable for the following:    Specific Gravity, Urine <1.005 (*)    Leukocytes, UA LARGE (*)    All other components within normal limits  URINE MICROSCOPIC-ADD ON - Abnormal; Notable for the following:    Squamous Epithelial / LPF FEW (*)    Bacteria, UA FEW (*)    All other components within normal limits  URINE CULTURE   Ct Head Wo Contrast  04/17/2012  *RADIOLOGY REPORT*  Clinical Data: Right leg weakness for 1 week time span  CT HEAD WITHOUT CONTRAST  Technique:  Contiguous axial images were obtained from the base of the skull through the vertex without contrast. Study was obtained within 24 hours of patient arrival at the emergency department.  Comparison: April 01, 2009  Findings:  The ventricles are normal in size and configuration. There is no mass, hemorrhage, extra-axial fluid collection, or midline shift.  Gray-white compartments are normal. There is no apparent acute infarct.  Bony calvarium appears intact.  Mastoids are hypoplastic but clear.   IMPRESSION: Mastoids are hypoplastic but clear.  Study otherwise unremarkable.   Original Report Authenticated By: Lowella Grip, M.D.    US Venous Img Lower Bilateral  04/17/2012  *RADIOLOGY REPORT*  Clinical Data: Bilateral leg pain and edema, numbness, history of DVT, hypertension, diabetes  VENOUS DUPLEX ULTRASOUND OF BILATERAL LOWER EXTREMITIES  Technique:  Gray-scale sonography with  graded compression, as well as color Doppler and duplex ultrasound, were performed to evaluate the deep venous system of both lower extremities from the level of the common femoral vein through the popliteal and proximal calf veins.  Spectral Doppler was utilized to evaluate flow at rest and with distal augmentation maneuvers.  Comparison:  Right lower extremity venous ultrasound 06/06/2007  Findings: Deep venous systems appear patent and compressible from groin through popliteal fossa bilaterally.  Spontaneous venous flow present with intact augmentation and evidence of respiratory phasicity.  No intraluminal thrombus identified.  Visualized portions of the greater saphenous systems appear patent. Small Baker's cyst left popliteal fossa.  IMPRESSION: No evidence of deep venous thrombosis in either lower extremity. Small Baker's cyst left popliteal fossa.   Original Report Authenticated By: Lavonia Dana, M.D.      1. Paresthesia of bilateral legs   2. Peripheral edema   3. Urinary tract infection   4. Elevated CK       MDM  Numbness which has not seemed to follow any particular neurologic pattern but I am concerned about the weakness which seems to be present on the or right. She'll be sent for a CT of head to make sure she has not had a recent stroke. Because she states this presentation is similar to when she had DVT in the past, she will get venous Dopplers minor problem seems to be edema and not DVT. She is on a statin, so CK level be checked.  Ultrasound shows no evidence of DVT in head CT is negative. CK is  moderately elevated and this certainly could be source of some of her problems. She will be discharged with prescription for furosemide. She also has evidence of urinary tract infection and sent home with prescription for cephalexin. She is to discontinue pravastatin and followup with PCP to decide whether to institute it new medications for hyperlipidemia.      I personally performed the services described in this documentation, which was scribed in my presence. The recorded information has been reviewed and is accurate.    Delora Fuel, MD [id number]

## 2012-04-17 NOTE — ED Notes (Signed)
Unable to obtain IV access due to poor access.  Pt refusing IV at this time.  Primary RN notified.

## 2012-04-17 NOTE — ED Notes (Signed)
Complain of swelling in both legs for a couple of weeks. States she has a hx of blood clots

## 2012-04-17 NOTE — ED Notes (Signed)
Pt reported she had to go to the bathroom. Pt ambulated to bathroom, urine sample obtained. MD consulted and verbalized if urine sample wasn't contaminated no in and out cath needed. Awaiting for urine results.

## 2012-04-17 NOTE — ED Notes (Addendum)
Pt approached nurses station and reported she wanted to see the EDP. Pt informed MD would be around shortly. Pt become agitated and walked out of ED. MD aware. MD stopped pt in hallway and reported was printing d/c instructions right now. Pt agreed to go back in room and review d/c instructions as well as receive one dose of abx before leaving.

## 2012-04-18 LAB — URINE CULTURE

## 2012-04-23 ENCOUNTER — Other Ambulatory Visit: Payer: Self-pay | Admitting: Neurology

## 2012-04-23 DIAGNOSIS — R209 Unspecified disturbances of skin sensation: Secondary | ICD-10-CM

## 2012-04-23 DIAGNOSIS — IMO0002 Reserved for concepts with insufficient information to code with codable children: Secondary | ICD-10-CM

## 2012-04-30 ENCOUNTER — Ambulatory Visit (HOSPITAL_COMMUNITY): Payer: Self-pay

## 2012-05-13 ENCOUNTER — Ambulatory Visit (HOSPITAL_COMMUNITY)
Admission: RE | Admit: 2012-05-13 | Discharge: 2012-05-13 | Disposition: A | Discharge status: Home / Self Care | Payer: BC Managed Care – PPO | Source: Ambulatory Visit | Attending: Neurology | Admitting: Neurology

## 2012-05-13 DIAGNOSIS — IMO0002 Reserved for concepts with insufficient information to code with codable children: Secondary | ICD-10-CM

## 2012-05-13 DIAGNOSIS — R209 Unspecified disturbances of skin sensation: Secondary | ICD-10-CM

## 2012-05-28 ENCOUNTER — Other Ambulatory Visit: Payer: Self-pay | Admitting: Family Medicine

## 2012-05-28 DIAGNOSIS — M79604 Pain in right leg: Secondary | ICD-10-CM

## 2012-05-29 ENCOUNTER — Other Ambulatory Visit: Payer: Self-pay

## 2012-06-01 ENCOUNTER — Other Ambulatory Visit: Payer: Self-pay

## 2012-06-02 ENCOUNTER — Ambulatory Visit
Admission: RE | Admit: 2012-06-02 | Discharge: 2012-06-02 | Disposition: A | Discharge status: Home / Self Care | Payer: BC Managed Care – PPO | Source: Ambulatory Visit | Attending: Family Medicine | Admitting: Family Medicine

## 2012-06-02 DIAGNOSIS — M79604 Pain in right leg: Secondary | ICD-10-CM

## 2012-06-05 ENCOUNTER — Ambulatory Visit
Admission: RE | Admit: 2012-06-05 | Discharge: 2012-06-05 | Disposition: A | Discharge status: Home / Self Care | Payer: BC Managed Care – PPO | Source: Ambulatory Visit | Attending: Neurology | Admitting: Neurology

## 2012-06-05 ENCOUNTER — Other Ambulatory Visit: Payer: Self-pay | Admitting: Neurology

## 2012-06-05 DIAGNOSIS — R209 Unspecified disturbances of skin sensation: Secondary | ICD-10-CM

## 2012-06-05 DIAGNOSIS — IMO0002 Reserved for concepts with insufficient information to code with codable children: Secondary | ICD-10-CM

## 2012-07-17 ENCOUNTER — Encounter: Payer: Self-pay | Admitting: Neurology

## 2012-07-17 ENCOUNTER — Ambulatory Visit (INDEPENDENT_AMBULATORY_CARE_PROVIDER_SITE_OTHER): Payer: BC Managed Care – PPO | Admitting: Neurology

## 2012-07-17 VITALS — BP 170/91 | HR 70 | Temp 97.7°F | Ht 64.0 in | Wt 284.0 lb

## 2012-07-17 DIAGNOSIS — M545 Low back pain, unspecified: Secondary | ICD-10-CM | POA: Insufficient documentation

## 2012-07-17 DIAGNOSIS — M47817 Spondylosis without myelopathy or radiculopathy, lumbosacral region: Secondary | ICD-10-CM

## 2012-07-17 DIAGNOSIS — E1149 Type 2 diabetes mellitus with other diabetic neurological complication: Secondary | ICD-10-CM

## 2012-07-17 DIAGNOSIS — E1142 Type 2 diabetes mellitus with diabetic polyneuropathy: Secondary | ICD-10-CM

## 2012-07-17 DIAGNOSIS — E1140 Type 2 diabetes mellitus with diabetic neuropathy, unspecified: Secondary | ICD-10-CM

## 2012-07-17 DIAGNOSIS — M48061 Spinal stenosis, lumbar region without neurogenic claudication: Secondary | ICD-10-CM

## 2012-07-17 DIAGNOSIS — M47816 Spondylosis without myelopathy or radiculopathy, lumbar region: Secondary | ICD-10-CM

## 2012-07-17 NOTE — Progress Notes (Signed)
Subjective:    Patient ID: Kathy Houston is a 52 y.o. female.  HPI  Star Age, MD, PhD [facility] [address], [address] [place], [place] [address]  Dear Dr. Berdine Addison,  I saw your patient, Kathy Houston, upon your kind request in my neurologic clinic today for initial consultation of her leg numbness. As you know, Kathy Houston is a very pleasant 52 year old right-handed African American lady with a past medical history of Chronic lower back pain, diabetes with history of neuropathy, obesity, hypertension, OSA on CPAP, hyperlipidemia who has been complaining of bilateral numbness down both legs and both feet as well as chronic back pain. She has numbness in her lateral R leg more than L. She denies any urinary or bowel incontinence and no trauma is reported. Current medications include valsartan, hydrochlorothiazide, tramadol, oxycodone, pravastatin and she was recently started on gabapentin. He ordered a lumbar spine MRI. Lab results recently include a CBC, lipid profile, liver function tests, hemoglobin A1c, and I reviewed her test results. Her A1c was 7, CMP was unremarkable, LDL was elevated at 141, glucose was elevated at 154. MRI lumbar spine from 06/05/2012 without contrast showed degenerative changes with multifactorial moderate to marked spinal stenosis and foraminal narrowing right greater than left with encroachment upon the exiting right L4 nerve root. Venous Doppler of the lower extremity on the right showed no evidence of DVT. She was seeing Dr. Merlene Laughter in [place], who was going to do an epidural injection, but she did not want to through with that.  She has not yet seen a back specialist. Gabapentin has been marginally helpful. She reports L shoulder pain, intermittently.   Her Past Medical History Is Significant For: Past Medical History  Diagnosis Date  . Hypertension   . Diabetes mellitus   . Chronic back pain   . Hypercholesteremia   .  Vertigo   . Blood clot in vein     Her Past Surgical History Is Significant For: Past Surgical History  Procedure Laterality Date  . Cholecystectomy      Her Family History Is Significant For: No family history on file.  Her Social History Is Significant For: History   Social History  . Marital Status: Single    Spouse Name: N/A    Number of Children: N/A  . Years of Education: N/A   Social History Main Topics  . Smoking status: Never Smoker   . Smokeless tobacco: None  . Alcohol Use: No  . Drug Use: No  . Sexually Active: None   Other Topics Concern  . None   Social History Narrative  . None    Her Allergies Are:  Allergies  Allergen Reactions  . Penicillins Itching  :   Her Current Medications Are:  Outpatient Encounter Prescriptions as of 07/17/2012  Medication Sig Dispense Refill  . aspirin EC 81 MG tablet Take 81 mg by mouth daily.      . cephALEXin (KEFLEX) 500 MG capsule Take 1 capsule (500 mg total) by mouth 4 (four) times daily.  40 capsule  0  . esomeprazole (NEXIUM) 40 MG capsule Take 40 mg by mouth daily before breakfast.      . furosemide (LASIX) 40 MG tablet Take 1 tablet (40 mg total) by mouth daily.  15 tablet  0  . hydrochlorothiazide (MICROZIDE) 12.5 MG capsule Take 12.5 mg by mouth daily.      Marland Kitchen ibuprofen (ADVIL,MOTRIN) 200 MG tablet Take 800 mg by mouth every 6 (  six) hours as needed. For pain      . Meclizine HCl (BONINE PO) Take 1 tablet by mouth.      . metFORMIN (GLUCOPHAGE) 500 MG tablet Take 250 mg by mouth daily with breakfast.      . olmesartan (BENICAR) 20 MG tablet Take 20 mg by mouth daily.      . ondansetron (ZOFRAN) 4 MG tablet Take 1 tablet (4 mg total) by mouth every 6 (six) hours.  12 tablet  0  . oxyCODONE-acetaminophen (PERCOCET) 10-325 MG per tablet Take 1 tablet by mouth every 4 (four) hours as needed for pain.      . traMADol (ULTRAM) 50 MG tablet Take 50 mg by mouth every 6 (six) hours as needed. For pain       No  facility-administered encounter medications on file as of 07/17/2012.   Review of Systems  Constitutional: Positive for fatigue.  Eyes: Positive for visual disturbance.  Respiratory:       Snoring  Cardiovascular: Positive for leg swelling.  Endocrine: Positive for heat intolerance.  Musculoskeletal: Positive for arthralgias.  Neurological: Positive for speech difficulty, numbness and headaches.       Restless leg  Hematological: Bruises/bleeds easily.  Psychiatric/Behavioral: Positive for confusion.    Objective:  Neurologic Exam  Physical Exam Physical Examination:   Filed Vitals:   07/17/12 1027  BP: 170/91  Pulse: 70  Temp: 97.7 F (36.5 C)    General Examination: The patient is a very pleasant 52 y.o. female in no acute distress, except for back pain. She appears well-developed and well-nourished and well groomed. She is obese.  HEENT: Normocephalic, atraumatic, pupils are equal, round and reactive to light and accommodation. Funduscopic exam is normal with sharp disc margins noted. Extraocular tracking is good without limitation to gaze excursion or nystagmus noted. Normal smooth pursuit is noted. Hearing is grossly intact. Tympanic membranes are clear bilaterally. Face is symmetric with normal facial animation and normal facial sensation. Speech is clear with no dysarthria noted. There is no hypophonia. There is no lip, neck/head, jaw or voice tremor. Neck is supple with full range of passive and active motion. There are no carotid bruits on auscultation. Oropharynx exam reveals: mild mouth dryness, good dental hygiene and moderate airway crowding, due to large tongue. Mallampati is class II. Tongue protrudes centrally and palate elevates symmetrically.   Chest: Clear to auscultation without wheezing, rhonchi or crackles noted.  Heart: S1+S2+0, regular and normal without murmurs, rubs or gallops noted.   Abdomen: Soft, non-tender and non-distended with normal bowel sounds  appreciated on auscultation.  Extremities: There is trace pitting edema in the distal lower extremities bilaterally. Pedal pulses are intact.  Skin: Warm and dry without trophic changes noted. There are no varicose veins.  Musculoskeletal: exam reveals no obvious joint deformities, tenderness or joint swelling or erythema.   Neurologically:  Mental status: The patient is awake, alert and oriented in all 4 spheres. Her memory, attention, language and knowledge are appropriate. There is no aphasia, agnosia, apraxia or anomia. Speech is clear with normal prosody and enunciation. Thought process is linear. Mood is congruent and affect is normal.  Cranial nerves are as described above under HEENT exam. In addition, shoulder shrug is normal with equal shoulder height noted. Motor exam: Normal bulk, strength and tone is noted, with the exception of pain limitation to lower extremity strength testing. She particularly has difficulty with hip flexion on the right and knee extension bilaterally. She has no foot  drop.. There is no drift, tremor or rebound. Romberg is negative. Reflexes are 1+ in the upper extremities and absent in the lower extremities. Toes are downgoing bilaterally. Fine motor skills are intact with normal finger taps, normal hand movements, normal rapid alternating patting, normal foot taps and normal foot agility.  Cerebellar testing shows no dysmetria or intention tremor on finger to nose testing. Heel to shin is not possible because of back pain. There is no truncal or gait ataxia.  Sensory exam is intact to light touch, pinprick, vibration, temperature sense in the upper extremities, but decrease to vibration, pinprick and temperature in the distal lower extremities but also in the lateral aspect of the right  Proximal lower extremity.  Gait, station and balance: she stands up with mild difficulty and reports some lower back pain and shooting pain to both lower extremities right more so  than left. She has a mild lumbar kyphosis. No veering to one side is noted. No leaning to one side is noted. Posture is  mildly stooped due to pain reported. No problems turning are noted. She is unable to do tandem walk, heel stand or to stand due to pain.  Assessment and Plan:   In summary, Kathy Houston is a very pleasant 52 y.o.-year old female with a history of  multiple medical problems and chronic back pain with evidence of degenerative lower spine disease. 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 I do believe that she should go ahead with a orthopedic evaluation for management of her degenerative lumbar spine disease. I did suggest EMG nerve conduction studies to her lower extremities as well as additional blood work today. I entered referral orthopedics as well. I talked her at length today and she was agreeable to the plan. For symptomatic treatment I have asked her to continue with her current medications including the gabapentin. I will see her back in about 3 months, sooner if the need arises. I have asked her to seek consultation with his specialist for back disease in the interim. Unfortunately there is no whole lot I can do for her at this time, as most likely this is structural disease of the lower back  Causing her symptoms. Thank you very much for allowing me to participate in the care of this nice patient. If I can be of any further assistance to you please do not hesitate to call me at [phone].  Sincerely,   Star Age, MD, PhD

## 2012-07-17 NOTE — Patient Instructions (Signed)
I do want to suggest a few things today:  As far as your medications are concerned, I would like to suggest no changes for now.   As far as diagnostic testing: EMG/NCV, referral to back specialist, blood today.  I would like to see you back in 3 months, sooner if we need to. Please call us with any interim questions, concerns, problems, updates or refill requests.  Please also call us for any test results so we can go over those with you on the phone. Richardson Landry is my clinical assistant and will answer any of your questions and relay your messages to me and also relay most of my messages to you.  Our phone number is [phone]. We also have an after hours call service for urgent matters and there is a physician on-call for urgent questions. For any emergencies you know to call 911 or go to the nearest emergency room.

## 2012-08-13 ENCOUNTER — Encounter (INDEPENDENT_AMBULATORY_CARE_PROVIDER_SITE_OTHER): Payer: BC Managed Care – PPO | Admitting: Radiology

## 2012-08-13 ENCOUNTER — Ambulatory Visit (INDEPENDENT_AMBULATORY_CARE_PROVIDER_SITE_OTHER): Payer: BC Managed Care – PPO | Admitting: Diagnostic Neuroimaging

## 2012-08-13 DIAGNOSIS — E1149 Type 2 diabetes mellitus with other diabetic neurological complication: Secondary | ICD-10-CM

## 2012-08-13 DIAGNOSIS — R209 Unspecified disturbances of skin sensation: Secondary | ICD-10-CM

## 2012-08-13 DIAGNOSIS — M47816 Spondylosis without myelopathy or radiculopathy, lumbar region: Secondary | ICD-10-CM

## 2012-08-13 DIAGNOSIS — M545 Low back pain: Secondary | ICD-10-CM

## 2012-08-13 DIAGNOSIS — Z0289 Encounter for other administrative examinations: Secondary | ICD-10-CM

## 2012-08-13 DIAGNOSIS — E1140 Type 2 diabetes mellitus with diabetic neuropathy, unspecified: Secondary | ICD-10-CM

## 2012-08-13 DIAGNOSIS — M48061 Spinal stenosis, lumbar region without neurogenic claudication: Secondary | ICD-10-CM

## 2012-08-15 ENCOUNTER — Telehealth: Payer: Self-pay | Admitting: Neurology

## 2012-08-18 ENCOUNTER — Telehealth: Payer: Self-pay | Admitting: *Deleted

## 2012-08-19 NOTE — Telephone Encounter (Signed)
Called patient left voicemail asking her to return my call.To let her know her results of her EMG   are not ready and as soon as they are we will call her with them.

## 2012-08-20 NOTE — Procedures (Signed)
   [facility]  NCS (NERVE CONDUCTION STUDY) WITH EMG (ELECTROMYOGRAPHY) REPORT   STUDY DATE: 08/13/12 PATIENT NAME: Kathy Houston DOB: 31-Mar-1960 MRN: [id number]  ORDERING CLINICIAN: Star Age, MD PhD   TECHNOLOGIST: Towana Badger ELECTROMYOGRAPHER: Earlean Polka. Penumalli, MD  CLINICAL INFORMATION: 52 year old female with numbness and pain in feet.  FINDINGS: NERVE CONDUCTION STUDY: Bilateral peroneal motor responses have normal distal latencies, amplitudes, conduction velocities and F-wave latencies. Bilateral tibial motor responses have normal distal latencies, decreased amplitude, normal conduction velocities and normal F-wave latencies. Bilateral H reflex responses with stimulation of the tibial nerves and recording over the soleus muscles are prolonged (35 ms). Bilateral sural sensory responses are normal.  NEEDLE ELECTROMYOGRAPHY: Needle examination of selected muscles of the left lower extremity (vastus medialis, tibialis anterior, gastrocnemius) shows no abnormal spontaneous activity at rest and normal motor unit recruitment on exertion. Left L5-S1 paraspinal muscles are normal.  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 No definite evidence of widespread polyneuropathy. Needle EMG was normal, and therefore does not help to further localize.   INTERPRETING PHYSICIAN:  Penni Bombard, MD Certified in Neurology, Neurophysiology and Neuroimaging  [facility] [address] [place], [place] [address] [phone]

## 2012-10-07 ENCOUNTER — Telehealth: Payer: Self-pay | Admitting: Neurology

## 2012-10-09 NOTE — Telephone Encounter (Signed)
Please advise patient that her EMG and nerve conduction test showed findings that could point towards lower back disease affecting the nerve roots. Her electrical nerve and muscle test did not show any evidence of peripheral neuropathy. I would recommend that she followup with her back doctor. If she requires a note for work she is advised to contact her primary care physician. Star Age, MD, PhD [facility])

## 2012-10-10 NOTE — Telephone Encounter (Signed)
Tried reaching patient to give results. No answer. Left vmail to return call.

## 2012-10-13 NOTE — Telephone Encounter (Signed)
Returned pt's call from Friday (09/2612 @ 2:07 PtM). Left message.

## 2012-10-29 ENCOUNTER — Ambulatory Visit: Payer: BC Managed Care – PPO | Admitting: Neurology

## 2012-12-30 ENCOUNTER — Ambulatory Visit: Payer: BC Managed Care – PPO | Admitting: Neurology

## 2012-12-30 ENCOUNTER — Telehealth: Payer: Self-pay | Admitting: Neurology

## 2012-12-30 NOTE — Telephone Encounter (Signed)
Patient called to reschedule her appointment that was scheduled for today 12/30/12. Per Dr Guadelupe Sabin schedule, nothing until 05/18/13 in the afternoon and patient prefers morning. Patient says she cannot wait until May to be seen and wants someone to call her back about being seen sooner because she is in a lot of pain. Please call the patient.

## 2012-12-30 NOTE — Telephone Encounter (Signed)
I called and spoke with patient. The next available appointment in the morning is June 15 2013 at 11:30 a.m.  I asked why she couldn't come today, as I see she had to also reschedule today's appointment from another day. She stated it is very difficult to get anyone to cover for her at her place of employment. Patient stated that she would keep the appointment that has been scheduled for her in May and will just take the day off.

## 2013-01-30 ENCOUNTER — Emergency Department (HOSPITAL_COMMUNITY)
Admission: EM | Admit: 2013-01-30 | Discharge: 2013-01-30 | Disposition: A | Discharge status: Home / Self Care | Payer: BC Managed Care – PPO | Attending: Emergency Medicine | Admitting: Emergency Medicine

## 2013-01-30 ENCOUNTER — Emergency Department (HOSPITAL_COMMUNITY): Payer: BC Managed Care – PPO

## 2013-01-30 DIAGNOSIS — M549 Dorsalgia, unspecified: Secondary | ICD-10-CM

## 2013-01-30 DIAGNOSIS — Z880 Allergy status to penicillin: Secondary | ICD-10-CM | POA: Insufficient documentation

## 2013-01-30 DIAGNOSIS — G8929 Other chronic pain: Secondary | ICD-10-CM | POA: Insufficient documentation

## 2013-01-30 DIAGNOSIS — E669 Obesity, unspecified: Secondary | ICD-10-CM | POA: Insufficient documentation

## 2013-01-30 DIAGNOSIS — Y9389 Activity, other specified: Secondary | ICD-10-CM | POA: Insufficient documentation

## 2013-01-30 DIAGNOSIS — S199XXA Unspecified injury of neck, initial encounter: Secondary | ICD-10-CM

## 2013-01-30 DIAGNOSIS — R5381 Other malaise: Secondary | ICD-10-CM | POA: Insufficient documentation

## 2013-01-30 DIAGNOSIS — Z79899 Other long term (current) drug therapy: Secondary | ICD-10-CM | POA: Insufficient documentation

## 2013-01-30 DIAGNOSIS — I10 Essential (primary) hypertension: Secondary | ICD-10-CM | POA: Insufficient documentation

## 2013-01-30 DIAGNOSIS — Y92410 Unspecified street and highway as the place of occurrence of the external cause: Secondary | ICD-10-CM | POA: Insufficient documentation

## 2013-01-30 DIAGNOSIS — R5383 Other fatigue: Secondary | ICD-10-CM

## 2013-01-30 DIAGNOSIS — Z7982 Long term (current) use of aspirin: Secondary | ICD-10-CM | POA: Insufficient documentation

## 2013-01-30 DIAGNOSIS — E119 Type 2 diabetes mellitus without complications: Secondary | ICD-10-CM | POA: Insufficient documentation

## 2013-01-30 DIAGNOSIS — S0993XA Unspecified injury of face, initial encounter: Secondary | ICD-10-CM | POA: Insufficient documentation

## 2013-01-30 DIAGNOSIS — IMO0002 Reserved for concepts with insufficient information to code with codable children: Secondary | ICD-10-CM | POA: Insufficient documentation

## 2013-01-30 LAB — URINALYSIS, ROUTINE W REFLEX MICROSCOPIC
Bilirubin Urine: NEGATIVE
Glucose, UA: NEGATIVE mg/dL
Ketones, ur: NEGATIVE mg/dL
Nitrite: NEGATIVE
PH: 6 (ref 5.0–8.0)
Protein, ur: NEGATIVE mg/dL
Urobilinogen, UA: 0.2 mg/dL (ref 0.0–1.0)

## 2013-01-30 LAB — URINE MICROSCOPIC-ADD ON

## 2013-01-30 MED ORDER — OXYCODONE-ACETAMINOPHEN 5-325 MG PO TABS: 2.0000 | ORAL_TABLET | ORAL | Status: DC | PRN

## 2013-01-30 MED ORDER — OXYCODONE-ACETAMINOPHEN 5-325 MG PO TABS
ORAL_TABLET | ORAL | Status: AC
  Administered 2013-01-30: 1
  Filled 2013-01-30: qty 1

## 2013-01-30 MED ORDER — IBUPROFEN 800 MG PO TABS: 800.0000 mg | ORAL_TABLET | Freq: Three times a day (TID) | ORAL | Status: AC

## 2013-01-30 MED ORDER — OXYCODONE-ACETAMINOPHEN 5-325 MG PO TABS
2.0000 | ORAL_TABLET | Freq: Once | ORAL | Status: AC
  Administered 2013-01-30: 1 via ORAL
  Filled 2013-01-30: qty 2
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
## 2013-01-30 NOTE — Discharge Instructions (Signed)
Back Pain, Adult Low back pain is very common. About 1 in 5 people have back pain.The cause of low back pain is rarely dangerous. The pain often gets better over time.About half of people with a sudden onset of back pain feel better in just 2 weeks. About 8 in 10 people feel better by 6 weeks.  CAUSES Some common causes of back pain include:  Strain of the muscles or ligaments supporting the spine.  Wear and tear (degeneration) of the spinal discs.  Arthritis.  Direct injury to the back. DIAGNOSIS Most of the time, the direct cause of low back pain is not known.However, back pain can be treated effectively even when the exact cause of the pain is unknown.Answering your caregiver's questions about your overall health and symptoms is one of the most accurate ways to make sure the cause of your pain is not dangerous. If your caregiver needs more information, he or she may order lab work or imaging tests (X-rays or MRIs).However, even if imaging tests show changes in your back, this usually does not require surgery. HOME CARE INSTRUCTIONS For many people, back pain returns.Since low back pain is rarely dangerous, it is often a condition that people can learn to manageon their own.   Remain active. It is stressful on the back to sit or stand in one place. Do not sit, drive, or stand in one place for more than 30 minutes at a time. Take short walks on level surfaces as soon as pain allows.Try to increase the length of time you walk each day.  Do not stay in bed.Resting more than 1 or 2 days can delay your recovery.  Do not avoid exercise or work.Your body is made to move.It is not dangerous to be active, even though your back may hurt.Your back will likely heal faster if you return to being active before your pain is gone.  Pay attention to your body when you bend and lift. Many people have less discomfortwhen lifting if they bend their knees, keep the load close to their bodies,and  avoid twisting. Often, the most comfortable positions are those that put less stress on your recovering back.  Find a comfortable position to sleep. Use a firm mattress and lie on your side with your knees slightly bent. If you lie on your back, put a pillow under your knees.  Only take over-the-counter or prescription medicines as directed by your caregiver. Over-the-counter medicines to reduce pain and inflammation are often the most helpful.Your caregiver may prescribe muscle relaxant drugs.These medicines help dull your pain so you can more quickly return to your normal activities and healthy exercise.  Put ice on the injured area.  Put ice in a plastic bag.  Place a towel between your skin and the bag.  Leave the ice on for 15-20 minutes, 03-04 times a day for the first 2 to 3 days. After that, ice and heat may be alternated to reduce pain and spasms.  Ask your caregiver about trying back exercises and gentle massage. This may be of some benefit.  Avoid feeling anxious or stressed.Stress increases muscle tension and can worsen back pain.It is important to recognize when you are anxious or stressed and learn ways to manage it.Exercise is a great option. SEEK MEDICAL CARE IF:  You have pain that is not relieved with rest or medicine.  You have pain that does not improve in 1 week.  You have new symptoms.  You are generally not feeling well. SEEK   IMMEDIATE MEDICAL CARE IF:   You have pain that radiates from your back into your legs.  You develop new bowel or bladder control problems.  You have unusual weakness or numbness in your arms or legs.  You develop nausea or vomiting.  You develop abdominal pain.  You feel faint. Document Released: 01/01/2005 Document Revised: 07/03/2011 Document Reviewed: 05/22/2010 [employer] Patient Information 2014 [employer].  
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
## 2013-04-16 NOTE — Telephone Encounter (Signed)
Closing encounter

## 2013-05-18 ENCOUNTER — Ambulatory Visit: Payer: Self-pay | Admitting: Neurology

## 2013-06-09 ENCOUNTER — Encounter: Payer: Self-pay | Admitting: Neurology

## 2013-06-09 ENCOUNTER — Ambulatory Visit (INDEPENDENT_AMBULATORY_CARE_PROVIDER_SITE_OTHER): Payer: Self-pay | Admitting: Neurology

## 2013-06-09 ENCOUNTER — Encounter (INDEPENDENT_AMBULATORY_CARE_PROVIDER_SITE_OTHER): Payer: Self-pay

## 2013-06-09 VITALS — BP 122/78 | HR 76 | Temp 98.0°F | Ht 64.0 in | Wt 287.0 lb

## 2013-06-09 DIAGNOSIS — M47817 Spondylosis without myelopathy or radiculopathy, lumbosacral region: Secondary | ICD-10-CM

## 2013-06-09 DIAGNOSIS — E1142 Type 2 diabetes mellitus with diabetic polyneuropathy: Secondary | ICD-10-CM

## 2013-06-09 DIAGNOSIS — M545 Low back pain, unspecified: Secondary | ICD-10-CM

## 2013-06-09 DIAGNOSIS — E1140 Type 2 diabetes mellitus with diabetic neuropathy, unspecified: Secondary | ICD-10-CM

## 2013-06-09 DIAGNOSIS — E1149 Type 2 diabetes mellitus with other diabetic neurological complication: Secondary | ICD-10-CM

## 2013-06-09 DIAGNOSIS — E669 Obesity, unspecified: Secondary | ICD-10-CM

## 2013-06-09 DIAGNOSIS — M47816 Spondylosis without myelopathy or radiculopathy, lumbar region: Secondary | ICD-10-CM
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
## 2013-06-09 NOTE — Patient Instructions (Signed)
I will refer you back to orthopedics. I am not sure what happened after the first referral.

## 2013-11-23 ENCOUNTER — Other Ambulatory Visit: Payer: Self-pay | Admitting: Primary Care

## 2013-11-23 ENCOUNTER — Ambulatory Visit
Admission: RE | Admit: 2013-11-23 | Discharge: 2013-11-23 | Disposition: A | Discharge status: Home / Self Care | Payer: No Typology Code available for payment source | Source: Ambulatory Visit | Attending: Primary Care | Admitting: Primary Care

## 2013-11-23 DIAGNOSIS — Z789 Other specified health status: Secondary | ICD-10-CM

## 2013-11-23 DIAGNOSIS — R0602 Shortness of breath: Secondary | ICD-10-CM

## 2014-01-24 ENCOUNTER — Emergency Department (HOSPITAL_COMMUNITY): Payer: 59

## 2014-01-24 ENCOUNTER — Encounter (HOSPITAL_COMMUNITY): Payer: Self-pay

## 2014-01-24 ENCOUNTER — Emergency Department (HOSPITAL_COMMUNITY)
Admission: EM | Admit: 2014-01-24 | Discharge: 2014-01-24 | Disposition: A | Discharge status: Home / Self Care | Payer: 59 | Attending: Emergency Medicine | Admitting: Emergency Medicine

## 2014-01-24 DIAGNOSIS — N858 Other specified noninflammatory disorders of uterus: Secondary | ICD-10-CM

## 2014-01-24 DIAGNOSIS — G8929 Other chronic pain: Secondary | ICD-10-CM | POA: Diagnosis not present

## 2014-01-24 DIAGNOSIS — R102 Pelvic and perineal pain: Secondary | ICD-10-CM

## 2014-01-24 DIAGNOSIS — N939 Abnormal uterine and vaginal bleeding, unspecified: Secondary | ICD-10-CM | POA: Insufficient documentation

## 2014-01-24 DIAGNOSIS — Z86718 Personal history of other venous thrombosis and embolism: Secondary | ICD-10-CM | POA: Insufficient documentation

## 2014-01-24 DIAGNOSIS — Z7982 Long term (current) use of aspirin: Secondary | ICD-10-CM | POA: Diagnosis not present

## 2014-01-24 DIAGNOSIS — E119 Type 2 diabetes mellitus without complications: Secondary | ICD-10-CM | POA: Insufficient documentation

## 2014-01-24 DIAGNOSIS — Z791 Long term (current) use of non-steroidal anti-inflammatories (NSAID): Secondary | ICD-10-CM | POA: Diagnosis not present

## 2014-01-24 DIAGNOSIS — N859 Noninflammatory disorder of uterus, unspecified: Secondary | ICD-10-CM | POA: Insufficient documentation

## 2014-01-24 DIAGNOSIS — Z880 Allergy status to penicillin: Secondary | ICD-10-CM | POA: Insufficient documentation

## 2014-01-24 DIAGNOSIS — Z79899 Other long term (current) drug therapy: Secondary | ICD-10-CM | POA: Diagnosis not present

## 2014-01-24 DIAGNOSIS — I10 Essential (primary) hypertension: Secondary | ICD-10-CM | POA: Diagnosis not present

## 2014-01-24 LAB — CBC WITH DIFFERENTIAL/PLATELET
Basophils Absolute: 0 10*3/uL (ref 0.0–0.1)
Basophils Relative: 1 % (ref 0–1)
Eosinophils Absolute: 0.3 10*3/uL (ref 0.0–0.7)
Eosinophils Relative: 3 % (ref 0–5)
HCT: 36.2 % (ref 36.0–46.0)
Hemoglobin: 11.8 g/dL — ABNORMAL LOW (ref 12.0–15.0)
LYMPHS ABS: 3.1 10*3/uL (ref 0.7–4.0)
Lymphocytes Relative: 38 % (ref 12–46)
MCH: 30.1 pg (ref 26.0–34.0)
MCHC: 32.6 g/dL (ref 30.0–36.0)
MCV: 92.3 fL (ref 78.0–100.0)
Monocytes Absolute: 0.6 10*3/uL (ref 0.1–1.0)
Monocytes Relative: 7 % (ref 3–12)
NEUTROS PCT: 51 % (ref 43–77)
Neutro Abs: 4.1 10*3/uL (ref 1.7–7.7)
Platelets: 297 10*3/uL (ref 150–400)
RBC: 3.92 MIL/uL (ref 3.87–5.11)
RDW: 14.1 % (ref 11.5–15.5)
WBC: 8.2 10*3/uL (ref 4.0–10.5)

## 2014-01-24 LAB — COMPREHENSIVE METABOLIC PANEL
ALT: 19 U/L (ref 0–35)
AST: 25 U/L (ref 0–37)
Albumin: 4.1 g/dL (ref 3.5–5.2)
Alkaline Phosphatase: 60 U/L (ref 39–117)
Anion gap: 7 (ref 5–15)
BILIRUBIN TOTAL: 0.7 mg/dL (ref 0.3–1.2)
BUN: 7 mg/dL (ref 6–23)
CALCIUM: 9.1 mg/dL (ref 8.4–10.5)
CO2: 24 mmol/L (ref 19–32)
CREATININE: 0.94 mg/dL (ref 0.50–1.10)
Chloride: 109 mEq/L (ref 96–112)
GFR calc Af Amer: 78 mL/min — ABNORMAL LOW (ref >90)
GFR, EST NON AFRICAN AMERICAN: 68 mL/min — AB (ref >90)
GLUCOSE: 113 mg/dL — AB (ref 70–99)
POTASSIUM: 3.9 mmol/L (ref 3.5–5.1)
Sodium: 140 mmol/L (ref 135–145)
TOTAL PROTEIN: 7.7 g/dL (ref 6.0–8.3)

## 2014-01-24 LAB — URINALYSIS, ROUTINE W REFLEX MICROSCOPIC
BILIRUBIN URINE: NEGATIVE
Glucose, UA: NEGATIVE mg/dL
KETONES UR: NEGATIVE mg/dL
NITRITE: NEGATIVE
PROTEIN: NEGATIVE mg/dL
Specific Gravity, Urine: 1.009 (ref 1.005–1.030)
Urobilinogen, UA: 0.2 mg/dL (ref 0.0–1.0)
pH: 5.5 (ref 5.0–8.0)

## 2014-01-24 LAB — URINE MICROSCOPIC-ADD ON

## 2014-01-24 LAB — WET PREP, GENITAL
CLUE CELLS WET PREP: NONE SEEN
Yeast Wet Prep HPF POC: NONE SEEN

## 2014-01-24 MED ORDER — ONDANSETRON HCL 4 MG/2ML IJ SOLN: 4.0000 mg | Freq: Once | INTRAMUSCULAR | Status: DC

## 2014-01-24 MED ORDER — ONDANSETRON 8 MG PO TBDP
8.0000 mg | ORAL_TABLET | Freq: Once | ORAL | Status: AC
  Administered 2014-01-24: 8 mg via ORAL
  Filled 2014-01-24: qty 1

## 2014-01-24 NOTE — ED Notes (Signed)
Vaginal bleeding x 3 weeks.  Red and then dark with clots.  Has had past d and c procedures.  Pt having abdominal pain.  Nausea/vomiting.  No fever.  Decreased appetite

## 2014-01-24 NOTE — ED Provider Notes (Signed)
CSN: [id number]     Arrival date & time 01/24/14  1449 History   First MD Initiated Contact with Patient 01/24/14 1557     Chief Complaint  Patient presents with  . Vaginal Bleeding     (Consider location/radiation/quality/duration/timing/severity/associated sxs/prior Treatment) HPI Comments: Patient presents today with a chief complaint of vaginal bleeding.  She states that she has been bleeding daily for the past three weeks. She states that she has had to change a tampon every ten minutes and that the tampon is saturated when she changes it.  She reports that her last menstrual period prior to this was approximately twenty years ago.  She states that she has had abnormal vaginal bleeding in the past and has had several D & C's done in the past due to this.  However, she states that her last D&C was approximately twenty years ago.  She reports associated nonradiating pain across her lower abdomen.  She has not taken anything for the pain.  She describes the pain as a pressure.  She denies fever, chills, chest pain, SOB, dizziness, or lightheadedness.  She is not on any anticoagulants.    Patient is a 54 y.o. female presenting with vaginal bleeding. The history is provided by the patient.  Vaginal Bleeding   Past Medical History  Diagnosis Date  . Hypertension   . Diabetes mellitus   . Chronic back pain   . Hypercholesteremia   . Vertigo   . Blood clot in vein    Past Surgical History  Procedure Laterality Date  . Cholecystectomy     History reviewed. No pertinent family history. History  Substance Use Topics  . Smoking status: Never Smoker   . Smokeless tobacco: Not on file  . Alcohol Use: No   OB History    No data available     Review of Systems  Genitourinary: Positive for vaginal bleeding.  All other systems reviewed and are negative.     Allergies  Penicillins  Home Medications   Prior to Admission medications   Medication Sig Start Date End Date Taking?  Authorizing Provider  aspirin EC 81 MG tablet Take 81 mg by mouth daily.    Historical Provider, MD  esomeprazole (NEXIUM) 40 MG capsule Take 40 mg by mouth daily before breakfast.    Historical Provider, MD  ibuprofen (ADVIL,MOTRIN) 200 MG tablet Take 800 mg by mouth every 6 (six) hours as needed. For pain    Historical Provider, MD  ibuprofen (ADVIL,MOTRIN) 800 MG tablet Take 1 tablet (800 mg total) by mouth 3 (three) times daily. 01/30/13   Ezequiel Essex, MD  Meclizine HCl (BONINE PO) Take 1 tablet by mouth as needed. dizziness    Historical Provider, MD  metFORMIN (GLUCOPHAGE) 500 MG tablet Take 250 mg by mouth daily with breakfast.    Historical Provider, MD  olmesartan (BENICAR) 20 MG tablet Take 20 mg by mouth daily.    Historical Provider, MD  oxyCODONE-acetaminophen (PERCOCET) 10-325 MG per tablet Take 1 tablet by mouth every 4 (four) hours as needed for pain.    Historical Provider, MD  oxyCODONE-acetaminophen (PERCOCET/ROXICET) 5-325 MG per tablet Take 2 tablets by mouth every 4 (four) hours as needed for severe pain. 01/30/13   Ezequiel Essex, MD  traMADol (ULTRAM) 50 MG tablet Take 50 mg by mouth every 6 (six) hours as needed. For pain    Historical Provider, MD   BP 158/97 mmHg  Pulse 96  Temp(Src) 98.3 F (36.8 C) (Oral)  Resp 16  SpO2 97%  LMP 01/03/2014 Physical Exam  Constitutional: She appears well-developed and well-nourished.  HENT:  Head: Normocephalic and atraumatic.  Mouth/Throat: Oropharynx is clear and moist.  Neck: Normal range of motion. Neck supple.  Cardiovascular: Normal rate, regular rhythm and normal heart sounds.   Pulmonary/Chest: Effort normal and breath sounds normal.  Abdominal: Soft. Bowel sounds are normal. She exhibits no mass. There is tenderness in the suprapubic area. There is no rebound and no guarding.  Genitourinary: Cervix exhibits no motion tenderness. Right adnexum displays tenderness. Right adnexum displays no mass and no fullness. Left  adnexum displays tenderness. Left adnexum displays no mass and no fullness.  Small amount of vaginal bleeding in the vaginal vault  Musculoskeletal: Normal range of motion.  Neurological: She is alert.  Skin: Skin is warm and dry.  Psychiatric: She has a normal mood and affect.  Nursing note and vitals reviewed.   ED Course  Procedures (including critical care time) Labs Review Labs Reviewed  CBC WITH DIFFERENTIAL - Abnormal; Notable for the following:    Hemoglobin 11.8 (*)    All other components within normal limits  COMPREHENSIVE METABOLIC PANEL - Abnormal; Notable for the following:    Glucose, Bld 113 (*)    GFR calc non Af Amer 68 (*)    GFR calc Af Amer 78 (*)    All other components within normal limits  URINALYSIS, ROUTINE W REFLEX MICROSCOPIC    Imaging Review US Transvaginal Non-ob  01/24/2014   CLINICAL DATA:  Pelvic pain. Hypertension. Diabetes. According to the ultrasound worksheet form, the patient's last menstrual period was 20 years ago.  EXAM: TRANSABDOMINAL AND TRANSVAGINAL ULTRASOUND OF PELVIS  TECHNIQUE: Both transabdominal and transvaginal ultrasound examinations of the pelvis were performed. Transabdominal technique was performed for global imaging of the pelvis including uterus, ovaries, adnexal regions, and pelvic cul-de-sac. It was necessary to proceed with endovaginal exam following the transabdominal exam to visualize the endometrium in uterus.  COMPARISON:  04/20/2005  FINDINGS: Uterus  Measurements: 11.1 by 6.2 by 6.7 cm. Anterior uterine body mass 5.0 by 5.9 by 5.4 cm.  Endometrium  Thickness: Difficult to measure but probably approximately 1 cm.  Right ovary  Unable the visualize  Left ovary  Measurements: 3.9 by 2.0 by 3.4 cm. Mildly heterogeneous appearance.  Other findings  No free fluid.  IMPRESSION: 1. Large anterior uterine body mass, probably a large fibroid. This represents a significant change from the prior exam. 2. Nonvisualization of the right  ovary. 3. Given the reported last menstrual. If 20 years ago, the endometrium is thickened at 1 cm, but without focal abnormality. 4. Prior left ovarian enlargement is no longer apparent, although the left are ovary is somewhat heterogeneous today. There is previously a left ovarian mass with posterior acoustic shadowing suspicious for dermoid. 5. Given the overall unusual constellation of findings, I would recommend a followup pelvic MRI with and without contrast for further characterization of the uterus, endometrium, and left ovary, as well as visualization of the right ovary.   Electronically Signed   By: Sherryl Barters M.D.   On: 01/24/2014 19:22   US Pelvis Complete  01/24/2014   CLINICAL DATA:  Pelvic pain. Hypertension. Diabetes. According to the ultrasound worksheet form, the patient's last menstrual period was 20 years ago.  EXAM: TRANSABDOMINAL AND TRANSVAGINAL ULTRASOUND OF PELVIS  TECHNIQUE: Both transabdominal and transvaginal ultrasound examinations of the pelvis were performed. Transabdominal technique was performed for global imaging of the pelvis  including uterus, ovaries, adnexal regions, and pelvic cul-de-sac. It was necessary to proceed with endovaginal exam following the transabdominal exam to visualize the endometrium in uterus.  COMPARISON:  04/20/2005  FINDINGS: Uterus  Measurements: 11.1 by 6.2 by 6.7 cm. Anterior uterine body mass 5.0 by 5.9 by 5.4 cm.  Endometrium  Thickness: Difficult to measure but probably approximately 1 cm.  Right ovary  Unable the visualize  Left ovary  Measurements: 3.9 by 2.0 by 3.4 cm. Mildly heterogeneous appearance.  Other findings  No free fluid.  IMPRESSION: 1. Large anterior uterine body mass, probably a large fibroid. This represents a significant change from the prior exam. 2. Nonvisualization of the right ovary. 3. Given the reported last menstrual. If 20 years ago, the endometrium is thickened at 1 cm, but without focal abnormality. 4. Prior left  ovarian enlargement is no longer apparent, although the left are ovary is somewhat heterogeneous today. There is previously a left ovarian mass with posterior acoustic shadowing suspicious for dermoid. 5. Given the overall unusual constellation of findings, I would recommend a followup pelvic MRI with and without contrast for further characterization of the uterus, endometrium, and left ovary, as well as visualization of the right ovary.   Electronically Signed   By: Sherryl Barters M.D.   On: 01/24/2014 19:22     EKG Interpretation None     Discussed Ultrasound results with Dr. Janeece Fitting with Radiology.  He states that he feels that the MRI can be done on an outpatient basis.   MDM   Final diagnoses:  None   Patient presents today with vaginal bleeding and lower abdominal pain.  She is not pregnant.  Hemoglobin today is stable.  VSS.  Ultrasound results as above showing possible Uterine Fibroid as well thickening of the Endometrium.  Discussed all of the Ultrasound results with the patient.  Patient instructed to follow up with OB/GYN.  She was explained the importance of following up with OB/GYN due to abnormalities with the ultrasound.  She demonstrates understanding.  Feel that the patient is stable for discharge.  Return precautions given.     Hyman Bible, PA-C 01/27/14 0011  Ephraim Hamburger, MD 01/29/14 2106

## 2014-01-24 NOTE — Discharge Instructions (Signed)
Your ultrasound showed some abnormalities.  It showed a uterine mass, which is most likely a fibroid.  Your endometrium is also thickened.  It is important for you to follow up with OB/GYN.  It is recommended that you have an outpatient MRI of your pelvis, but it will be up to the OB/GYN.

## 2014-01-25 LAB — GC/CHLAMYDIA PROBE AMP
CT PROBE, AMP APTIMA: NEGATIVE
GC PROBE AMP APTIMA: NEGATIVE

## 2014-03-17 ENCOUNTER — Other Ambulatory Visit: Payer: Self-pay | Admitting: Obstetrics and Gynecology

## 2014-03-29 ENCOUNTER — Encounter (HOSPITAL_COMMUNITY)
Admission: RE | Admit: 2014-03-29 | Discharge: 2014-03-29 | Disposition: A | Discharge status: Home / Self Care | Payer: 59 | Source: Ambulatory Visit | Attending: Obstetrics and Gynecology | Admitting: Obstetrics and Gynecology

## 2014-03-29 ENCOUNTER — Other Ambulatory Visit: Payer: Self-pay

## 2014-03-29 ENCOUNTER — Encounter (HOSPITAL_COMMUNITY): Payer: Self-pay

## 2014-03-29 ENCOUNTER — Encounter (INDEPENDENT_AMBULATORY_CARE_PROVIDER_SITE_OTHER): Payer: Self-pay

## 2014-03-29 DIAGNOSIS — Z9889 Other specified postprocedural states: Secondary | ICD-10-CM | POA: Diagnosis not present

## 2014-03-29 DIAGNOSIS — E119 Type 2 diabetes mellitus without complications: Secondary | ICD-10-CM | POA: Diagnosis not present

## 2014-03-29 DIAGNOSIS — Z79899 Other long term (current) drug therapy: Secondary | ICD-10-CM | POA: Diagnosis not present

## 2014-03-29 DIAGNOSIS — G4730 Sleep apnea, unspecified: Secondary | ICD-10-CM | POA: Diagnosis not present

## 2014-03-29 DIAGNOSIS — D649 Anemia, unspecified: Secondary | ICD-10-CM | POA: Diagnosis not present

## 2014-03-29 DIAGNOSIS — K219 Gastro-esophageal reflux disease without esophagitis: Secondary | ICD-10-CM | POA: Diagnosis not present

## 2014-03-29 DIAGNOSIS — M545 Low back pain: Secondary | ICD-10-CM | POA: Diagnosis not present

## 2014-03-29 DIAGNOSIS — G8929 Other chronic pain: Secondary | ICD-10-CM | POA: Diagnosis not present

## 2014-03-29 DIAGNOSIS — N950 Postmenopausal bleeding: Secondary | ICD-10-CM | POA: Diagnosis present

## 2014-03-29 DIAGNOSIS — D259 Leiomyoma of uterus, unspecified: Secondary | ICD-10-CM | POA: Diagnosis not present

## 2014-03-29 DIAGNOSIS — E780 Pure hypercholesterolemia: Secondary | ICD-10-CM | POA: Diagnosis not present

## 2014-03-29 DIAGNOSIS — Z6841 Body Mass Index (BMI) 40.0 and over, adult: Secondary | ICD-10-CM | POA: Diagnosis not present

## 2014-03-29 DIAGNOSIS — Z86718 Personal history of other venous thrombosis and embolism: Secondary | ICD-10-CM | POA: Diagnosis not present

## 2014-03-29 DIAGNOSIS — Z9989 Dependence on other enabling machines and devices: Secondary | ICD-10-CM | POA: Diagnosis not present

## 2014-03-29 DIAGNOSIS — I10 Essential (primary) hypertension: Secondary | ICD-10-CM | POA: Diagnosis not present

## 2014-03-29 DIAGNOSIS — N840 Polyp of corpus uteri: Secondary | ICD-10-CM | POA: Diagnosis not present

## 2014-03-29 HISTORY — DX: Gastro-esophageal reflux disease without esophagitis: K21.9

## 2014-03-29 HISTORY — DX: Depression, unspecified: F32.A

## 2014-03-29 HISTORY — DX: Major depressive disorder, single episode, unspecified: F32.9

## 2014-03-29 HISTORY — DX: Nausea with vomiting, unspecified: R11.2

## 2014-03-29 HISTORY — DX: Unspecified thoracic, thoracolumbar and lumbosacral intervertebral disc disorder: M51.9

## 2014-03-29 HISTORY — DX: Other specified postprocedural states: Z98.890

## 2014-03-29 HISTORY — DX: Headache, unspecified: R51.9

## 2014-03-29 HISTORY — DX: Reserved for inherently not codable concepts without codable children: IMO0001

## 2014-03-29 HISTORY — DX: Sleep apnea, unspecified: G47.30

## 2014-03-29 HISTORY — DX: Headache: R51

## 2014-03-29 LAB — CBC
HCT: 35.7 % — ABNORMAL LOW (ref 36.0–46.0)
Hemoglobin: 11.2 g/dL — ABNORMAL LOW (ref 12.0–15.0)
MCH: 29.2 pg (ref 26.0–34.0)
MCHC: 31.4 g/dL (ref 30.0–36.0)
MCV: 93 fL (ref 78.0–100.0)
PLATELETS: 264 10*3/uL (ref 150–400)
RBC: 3.84 MIL/uL — ABNORMAL LOW (ref 3.87–5.11)
RDW: 14.2 % (ref 11.5–15.5)
WBC: 6.4 10*3/uL (ref 4.0–10.5)

## 2014-03-29 LAB — BASIC METABOLIC PANEL
Anion gap: 8 (ref 5–15)
BUN: 15 mg/dL (ref 6–23)
CO2: 32 mmol/L (ref 19–32)
Calcium: 8.7 mg/dL (ref 8.4–10.5)
Chloride: 100 mmol/L (ref 96–112)
Creatinine, Ser: 1.31 mg/dL — ABNORMAL HIGH (ref 0.50–1.10)
GFR calc Af Amer: 52 mL/min — ABNORMAL LOW (ref >90)
GFR, EST NON AFRICAN AMERICAN: 45 mL/min — AB (ref >90)
GLUCOSE: 148 mg/dL — AB (ref 70–99)
POTASSIUM: 4 mmol/L (ref 3.5–5.1)
Sodium: 140 mmol/L (ref 135–145)

## 2014-03-29 NOTE — H&P (Signed)
  Admission History and Physical Exam for a Gynecology Patient  [name] is a 54 y.o. female, No obstetric history on file., who presents for hysteroscopy, dilation and curettage, possible resection of an atrial polyps, and possible NovaSure ablation of the endometrium. She has been followed at the [facility] and Gynecology division of [facility]. The patient has postmenopausal bleeding.  Endometrial biopsy was benign.  OB History    No data available      Past Medical History  Diagnosis Date  . Hypertension   . Diabetes mellitus   . Chronic back pain   . Hypercholesteremia   . Vertigo   . Blood clot in vein   . Sleep apnea   . Ruptured disk     Lower lumbar  . Headache   . GERD (gastroesophageal reflux disease)   . Depression     no treatment  . Shortness of breath dyspnea     on exertion  . PONV (postoperative nausea and vomiting)     No prescriptions prior to admission    Past Surgical History  Procedure Laterality Date  . Cholecystectomy    . Dilation and curettage of uterus      x 5    Allergies  Allergen Reactions  . Penicillins Itching    Family History: family history is not on file.  Social History:  reports that she has never smoked. She does not have any smokeless tobacco history on file. She reports that she does not drink alcohol or use illicit drugs.  Review of systems: See HPI.  Admission Physical Exam:    BMI is 49.4.  Last menstrual period 01/03/2014.  HEENT:                 Within normal limits Chest:                   Clear Heart:                    Regular rate and rhythm Breasts:                No masses, skin changes, bleeding, or discharge present Abdomen:             Nontender, no masses Extremities:          Grossly normal Neurologic exam: Grossly normal  Pelvic exam:  External genitalia: normal general appearance Vaginal: normal mucosa without prolapse or lesions Cervix: normal  appearance Adnexa: normal bimanual exam Uterus: upper limits normal size, difficult to evaluate because of increased BMI  Assessment:  Postmenopausal bleeding  Fibroid uterus  Endometrial polyp  Obesity  Hypertension  Diabetes  History of DVT  Hypercholesterolemia  Sleep apnea  Chronic back pain  Anemia  Plan:  The patient will undergo hysteroscopy and dilation and curettage.  If endometrial polyps are found, then we will proceed with resection of the polyps.  The patient may want to have a NovaSure ablation of the endometrium.  The patient understands the indications for her surgical procedure.  She is aware her treatment options.  She accepts the risk of, but not limited to, anesthetic complications, bleeding, infections, and possible damage to surrounding organs.   Eli Hose 03/29/2014

## 2014-03-29 NOTE — Patient Instructions (Addendum)
Your procedure is scheduled on:03/30/14  Enter through the Main Entrance at :9am Pick up desk phone and dial [phone] and inform us of your arrival.  Please call [phone] if you have any problems the morning of surgery.  Remember: Do not eat food or drink liquids, including water, after midnight:tonight Clear liquids are ok until:6:30 am  HOLD METFORMIN FOR 24 HOURS PRIOR TO SURGERY. You may brush your teeth the morning of surgery.  Take these meds the morning of surgery with a sip of water:Nexium, Valsartan...bring your inhaler to hospital  DO NOT wear jewelry, eye make-up, lipstick,body lotion, or dark fingernail polish.  (Polished toes are ok) You may wear deodorant.  If you are to be admitted after surgery, leave suitcase in car until your room has been assigned. Patients discharged on the day of surgery will not be allowed to drive home. Wear loose fitting, comfortable clothes for your ride home.

## 2014-03-30 ENCOUNTER — Ambulatory Visit (HOSPITAL_COMMUNITY): Payer: 59 | Admitting: Anesthesiology

## 2014-03-30 ENCOUNTER — Encounter (HOSPITAL_COMMUNITY): Payer: Self-pay | Admitting: Anesthesiology

## 2014-03-30 ENCOUNTER — Encounter (HOSPITAL_COMMUNITY)
Admission: RE | Disposition: A | Discharge status: Home / Self Care | Payer: Self-pay | Source: Ambulatory Visit | Attending: Obstetrics and Gynecology

## 2014-03-30 ENCOUNTER — Ambulatory Visit (HOSPITAL_COMMUNITY)
Admission: RE | Admit: 2014-03-30 | Discharge: 2014-03-30 | Disposition: A | Discharge status: Home / Self Care | Payer: 59 | Source: Ambulatory Visit | Attending: Obstetrics and Gynecology | Admitting: Obstetrics and Gynecology

## 2014-03-30 DIAGNOSIS — D259 Leiomyoma of uterus, unspecified: Secondary | ICD-10-CM | POA: Insufficient documentation

## 2014-03-30 DIAGNOSIS — Z9989 Dependence on other enabling machines and devices: Secondary | ICD-10-CM | POA: Insufficient documentation

## 2014-03-30 DIAGNOSIS — D649 Anemia, unspecified: Secondary | ICD-10-CM | POA: Insufficient documentation

## 2014-03-30 DIAGNOSIS — K219 Gastro-esophageal reflux disease without esophagitis: Secondary | ICD-10-CM | POA: Insufficient documentation

## 2014-03-30 DIAGNOSIS — M545 Low back pain: Secondary | ICD-10-CM | POA: Insufficient documentation

## 2014-03-30 DIAGNOSIS — G4730 Sleep apnea, unspecified: Secondary | ICD-10-CM | POA: Insufficient documentation

## 2014-03-30 DIAGNOSIS — Z79899 Other long term (current) drug therapy: Secondary | ICD-10-CM | POA: Insufficient documentation

## 2014-03-30 DIAGNOSIS — E780 Pure hypercholesterolemia: Secondary | ICD-10-CM | POA: Insufficient documentation

## 2014-03-30 DIAGNOSIS — N840 Polyp of corpus uteri: Secondary | ICD-10-CM | POA: Insufficient documentation

## 2014-03-30 DIAGNOSIS — Z6841 Body Mass Index (BMI) 40.0 and over, adult: Secondary | ICD-10-CM | POA: Insufficient documentation

## 2014-03-30 DIAGNOSIS — G8929 Other chronic pain: Secondary | ICD-10-CM | POA: Insufficient documentation

## 2014-03-30 DIAGNOSIS — Z86718 Personal history of other venous thrombosis and embolism: Secondary | ICD-10-CM | POA: Insufficient documentation

## 2014-03-30 DIAGNOSIS — Z9889 Other specified postprocedural states: Secondary | ICD-10-CM | POA: Insufficient documentation

## 2014-03-30 DIAGNOSIS — E119 Type 2 diabetes mellitus without complications: Secondary | ICD-10-CM | POA: Insufficient documentation

## 2014-03-30 DIAGNOSIS — I10 Essential (primary) hypertension: Secondary | ICD-10-CM | POA: Insufficient documentation

## 2014-03-30 HISTORY — PX: DILITATION & CURRETTAGE/HYSTROSCOPY WITH NOVASURE ABLATION: SHX5568

## 2014-03-30 HISTORY — PX: DILATATION & CURRETTAGE/HYSTEROSCOPY WITH RESECTOCOPE: SHX5572

## 2014-03-30 LAB — GLUCOSE, CAPILLARY
GLUCOSE-CAPILLARY: 139 mg/dL — AB (ref 70–99)
Glucose-Capillary: 83 mg/dL (ref 70–99)

## 2014-03-30 SURGERY — DILATATION & CURETTAGE/HYSTEROSCOPY WITH NOVASURE ABLATION
Anesthesia: General | Site: Uterus

## 2014-03-30 MED ORDER — DIPHENHYDRAMINE HCL 50 MG/ML IJ SOLN
INTRAMUSCULAR | Status: AC
  Administered 2014-03-30: 25 mg via INTRAVENOUS
  Filled 2014-03-30: qty 1

## 2014-03-30 MED ORDER — PROPOFOL 10 MG/ML IV BOLUS
INTRAVENOUS | Status: AC
  Filled 2014-03-30: qty 20

## 2014-03-30 MED ORDER — FENTANYL CITRATE 0.05 MG/ML IJ SOLN
INTRAMUSCULAR | Status: DC | PRN
  Administered 2014-03-30: 100 ug via INTRAVENOUS

## 2014-03-30 MED ORDER — SCOPOLAMINE 1 MG/3DAYS TD PT72
1.0000 | MEDICATED_PATCH | Freq: Once | TRANSDERMAL | Status: DC
Start: 2014-03-30 — End: 2014-03-30
  Administered 2014-03-30: 1.5 mg via TRANSDERMAL

## 2014-03-30 MED ORDER — PANTOPRAZOLE SODIUM 40 MG PO TBEC
DELAYED_RELEASE_TABLET | ORAL | Status: AC
  Administered 2014-03-30: 40 mg via ORAL
  Filled 2014-03-30: qty 1

## 2014-03-30 MED ORDER — DIPHENHYDRAMINE HCL 50 MG/ML IJ SOLN
25.0000 mg | Freq: Once | INTRAMUSCULAR | Status: AC
  Administered 2014-03-30: 25 mg via INTRAVENOUS

## 2014-03-30 MED ORDER — METOCLOPRAMIDE HCL 5 MG/ML IJ SOLN
INTRAMUSCULAR | Status: AC
  Administered 2014-03-30: 10 mg via INTRAVENOUS
  Filled 2014-03-30: qty 2

## 2014-03-30 MED ORDER — OXYCODONE-ACETAMINOPHEN 5-325 MG PO TABS: 1.0000 | ORAL_TABLET | ORAL | Status: AC | PRN

## 2014-03-30 MED ORDER — LACTATED RINGERS IR SOLN
Status: DC | PRN
  Administered 2014-03-30: 3000 mL

## 2014-03-30 MED ORDER — PROPOFOL INFUSION 10 MG/ML OPTIME
INTRAVENOUS | Status: DC | PRN
  Administered 2014-03-30: 160 mL via INTRAVENOUS
  Administered 2014-03-30: 180 mL via INTRAVENOUS
  Administered 2014-03-30: 140 mL via INTRAVENOUS

## 2014-03-30 MED ORDER — MIDAZOLAM HCL 2 MG/2ML IJ SOLN
INTRAMUSCULAR | Status: DC | PRN
  Administered 2014-03-30: 2 mg via INTRAVENOUS

## 2014-03-30 MED ORDER — ONDANSETRON HCL 4 MG/2ML IJ SOLN
INTRAMUSCULAR | Status: DC | PRN
  Administered 2014-03-30: 4 mg via INTRAVENOUS

## 2014-03-30 MED ORDER — FENTANYL CITRATE 0.05 MG/ML IJ SOLN: 25.0000 ug | INTRAMUSCULAR | Status: DC | PRN

## 2014-03-30 MED ORDER — HEPARIN SODIUM (PORCINE) 5000 UNIT/ML IJ SOLN
5000.0000 [IU] | INTRAMUSCULAR | Status: AC
  Administered 2014-03-30: 5000 [IU] via SUBCUTANEOUS

## 2014-03-30 MED ORDER — ONDANSETRON HCL 4 MG/2ML IJ SOLN
INTRAMUSCULAR | Status: AC
  Filled 2014-03-30: qty 2

## 2014-03-30 MED ORDER — MIDAZOLAM HCL 2 MG/2ML IJ SOLN
INTRAMUSCULAR | Status: AC
  Filled 2014-03-30: qty 2

## 2014-03-30 MED ORDER — DEXAMETHASONE SODIUM PHOSPHATE 10 MG/ML IJ SOLN
INTRAMUSCULAR | Status: DC | PRN
  Administered 2014-03-30: 4 mg via INTRAVENOUS

## 2014-03-30 MED ORDER — METOCLOPRAMIDE HCL 5 MG/ML IJ SOLN
10.0000 mg | Freq: Once | INTRAMUSCULAR | Status: AC | PRN
  Administered 2014-03-30: 10 mg via INTRAVENOUS

## 2014-03-30 MED ORDER — GLYCOPYRROLATE 0.2 MG/ML IJ SOLN
INTRAMUSCULAR | Status: DC | PRN
  Administered 2014-03-30: 0.2 mg via INTRAVENOUS

## 2014-03-30 MED ORDER — MEPERIDINE HCL 25 MG/ML IJ SOLN: 6.2500 mg | INTRAMUSCULAR | Status: DC | PRN

## 2014-03-30 MED ORDER — DEXAMETHASONE SODIUM PHOSPHATE 4 MG/ML IJ SOLN
INTRAMUSCULAR | Status: AC
Start: 2014-03-30 — End: 2014-03-30
  Filled 2014-03-30: qty 1

## 2014-03-30 MED ORDER — HEPARIN SODIUM (PORCINE) 5000 UNIT/ML IJ SOLN
INTRAMUSCULAR | Status: AC
  Administered 2014-03-30: 5000 [IU] via SUBCUTANEOUS
  Filled 2014-03-30: qty 1

## 2014-03-30 MED ORDER — PROPOFOL 10 MG/ML IV BOLUS
INTRAVENOUS | Status: DC | PRN
  Administered 2014-03-30: 120 mg via INTRAVENOUS
  Administered 2014-03-30: 200 mg via INTRAVENOUS

## 2014-03-30 MED ORDER — LIDOCAINE HCL (CARDIAC) 20 MG/ML IV SOLN
INTRAVENOUS | Status: AC
  Filled 2014-03-30: qty 5

## 2014-03-30 MED ORDER — LACTATED RINGERS IV SOLN
INTRAVENOUS | Status: DC
  Administered 2014-03-30 (×2): via INTRAVENOUS

## 2014-03-30 MED ORDER — SCOPOLAMINE 1 MG/3DAYS TD PT72
MEDICATED_PATCH | TRANSDERMAL | Status: AC
  Administered 2014-03-30: 1.5 mg via TRANSDERMAL
  Filled 2014-03-30: qty 1

## 2014-03-30 MED ORDER — BUPIVACAINE-EPINEPHRINE 0.5% -1:200000 IJ SOLN
INTRAMUSCULAR | Status: DC | PRN
  Administered 2014-03-30: 10 mL

## 2014-03-30 MED ORDER — FENTANYL CITRATE 0.05 MG/ML IJ SOLN
INTRAMUSCULAR | Status: AC
  Filled 2014-03-30: qty 2

## 2014-03-30 MED ORDER — GLYCINE 1.5 % IR SOLN
Status: DC | PRN
  Administered 2014-03-30: 3000 mL

## 2014-03-30 MED ORDER — BUPIVACAINE-EPINEPHRINE (PF) 0.5% -1:200000 IJ SOLN
INTRAMUSCULAR | Status: AC
  Filled 2014-03-30: qty 30

## 2014-03-30 MED ORDER — LIDOCAINE HCL (CARDIAC) 20 MG/ML IV SOLN
INTRAVENOUS | Status: DC | PRN
  Administered 2014-03-30: 100 mg via INTRAVENOUS

## 2014-03-30 MED ORDER — PANTOPRAZOLE SODIUM 40 MG PO TBEC
40.0000 mg | DELAYED_RELEASE_TABLET | Freq: Every day | ORAL | Status: DC
  Administered 2014-03-30: 40 mg via ORAL

## 2014-03-30 SURGICAL SUPPLY — 20 items
CANISTER SUCT 3000ML (MISCELLANEOUS) ×2 IMPLANT
CATH ROBINSON RED A/P 16FR (CATHETERS) ×2 IMPLANT
CLOTH BEACON ORANGE TIMEOUT ST (SAFETY) ×2 IMPLANT
CONTAINER PREFILL 10% NBF 60ML (FORM) ×4 IMPLANT
ELECT REM PT RETURN 9FT ADLT (ELECTROSURGICAL)
ELECTRODE REM PT RTRN 9FT ADLT (ELECTROSURGICAL) IMPLANT
GLOVE BIOGEL PI IND STRL 7.0 (GLOVE) IMPLANT
GLOVE BIOGEL PI IND STRL 8.5 (GLOVE) ×1 IMPLANT
GLOVE BIOGEL PI INDICATOR 7.0 (GLOVE) ×3
GLOVE BIOGEL PI INDICATOR 8.5 (GLOVE) ×1
GLOVE ECLIPSE 8.0 STRL XLNG CF (GLOVE) ×4 IMPLANT
GLOVE SURG SS PI 7.0 STRL IVOR (GLOVE) ×2 IMPLANT
GOWN STRL REUS W/TWL LRG LVL3 (GOWN DISPOSABLE) ×5 IMPLANT
LOOP ANGLED CUTTING 22FR (CUTTING LOOP) ×1 IMPLANT
PACK VAGINAL MINOR WOMEN LF (CUSTOM PROCEDURE TRAY) ×2 IMPLANT
PAD OB MATERNITY 4.3X12.25 (PERSONAL CARE ITEMS) ×2 IMPLANT
TOWEL OR 17X24 6PK STRL BLUE (TOWEL DISPOSABLE) ×4 IMPLANT
TUBING AQUILEX INFLOW (TUBING) ×2 IMPLANT
TUBING AQUILEX OUTFLOW (TUBING) ×2 IMPLANT
WATER STERILE IRR 1000ML POUR (IV SOLUTION) ×2 IMPLANT

## 2014-03-30 NOTE — H&P (Signed)
The patient was interviewed and examined today.  The previously documented history and physical examination was reviewed. There are no changes. The operative procedure was reviewed. The risks and benefits were outlined again. The specific risks include, but are not limited to, anesthetic complications, bleeding, infections, and possible damage to the surrounding organs. The patient's questions were answered.  We are ready to proceed as outlined. The likelihood of the patient achieving the goals of this procedure is very likely.   BP 140/74 mmHg  Pulse 68  Temp(Src) 98.2 F (36.8 C) (Oral)  SpO2 95%  LMP 01/03/2014   Gildardo Cranker, M.D.

## 2014-03-30 NOTE — Anesthesia Preprocedure Evaluation (Addendum)
Anesthesia Evaluation  Patient identified by MRN, date of birth, ID band Patient awake    Reviewed: Allergy & Precautions, NPO status , Patient's Chart, lab work & pertinent test results, reviewed documented beta blocker date and time   History of Anesthesia Complications (+) PONV and history of anesthetic complications  Airway Mallampati: III  TM Distance: >3 FB Neck ROM: Full    Dental no notable dental hx. (+) Teeth Intact, Upper Dentures   Pulmonary shortness of breath, sleep apnea and Continuous Positive Airway Pressure Ventilation ,  breath sounds clear to auscultation  Pulmonary exam normal       Cardiovascular hypertension, Pt. on medications Rhythm:Regular Rate:Normal  Hx/o superficial venous thrombosis   Neuro/Psych  Headaches, PSYCHIATRIC DISORDERS Depression    GI/Hepatic Neg liver ROS, GERD-  Medicated and Controlled,  Endo/Other  diabetes, Well Controlled, Type 2, Oral Hypoglycemic AgentsMorbid obesityHypercholesterolemia  Renal/GU negative Renal ROS  negative genitourinary   Musculoskeletal Chronic low back pain   Abdominal (+) + obese,   Peds  Hematology   Anesthesia Other Findings   Reproductive/Obstetrics Pelvic pain menorrhagia                           Anesthesia Physical Anesthesia Plan  ASA: III  Anesthesia Plan: General   Post-op Pain Management:    Induction: Intravenous  Airway Management Planned: LMA  Additional Equipment:   Intra-op Plan:   Post-operative Plan: Extubation in OR  Informed Consent: I have reviewed the patients History and Physical, chart, labs and discussed the procedure including the risks, benefits and alternatives for the proposed anesthesia with the patient or authorized representative who has indicated his/her understanding and acceptance.   Dental advisory given  Plan Discussed with: CRNA, Anesthesiologist and  Surgeon  Anesthesia Plan Comments:         Anesthesia Quick Evaluation

## 2014-03-30 NOTE — Op Note (Signed)
OPERATIVE NOTE  Kathy Houston  DOB:    1960/10/10  MRN:    [id number]  CSN:    [id number]  Date of Surgery:  03/30/2014  Preoperative Diagnosis:  Postmenopausal bleeding  Fibroid uterus  Endometrial polyp  Obesity  Hypertension  Diabetes  Hypercholesterolemia  Anemia  History of a deep venous thrombosis  Postoperative Diagnosis:  Same  Procedure:  Hysteroscopy with resection of endometrial polyps Dilatation and curettage NovaSure ablation of the endometrium  Surgeon:  Gildardo Cranker, M.D.  Assistant:  None  Anesthetic:  General  Disposition:  The patient is a 54 y.o.-year-old female who presents with the above diagnosis. She understands the indications for her surgical procedure. She accepts the risk of, but not limited to, anesthetic complications, bleeding, infections, and possible damage to the surrounding organs.  Findings:  On examination under anesthesia the uterus was upper limits normal size. No adnexal masses were appreciated. No parametrial disease was appreciated. The examination is limited by an elevated BMI. The uterus sounded to 9 cm. The patient was noted to have a 2 cm endometrial polyp.  Procedure:  The patient was taken to the operating room where a general anesthetic was given. The perineum and vagina were prepped with Betadine. The bladder was drained of urine. The patient was sterilely draped. Examination under anesthesia was performed. A paracervical block was placed using 10 cc of half percent Marcaine with epinephrine. An endocervical curettage was performed. The cervix was gently dilated. The diagnostic hysteroscope was inserted and the cavity was carefully inspected. Pictures were taken. Findings included: 2 cm endometrial polyp. The tubal ostia appeared normal. The diagnostic hysteroscope was removed. The cervix was dilated further. The operative hysteroscope was inserted. The polyp was resected using a single loop. The  cavity was then curetted using a sharp curet. The cavity was felt to be clean at the end of our procedure. The NovaSure instrument was inserted. The cavity length was noted to be 6 cm. The cavity width was 4.1 cm. The NovaSure instrument was tested, and the cavity was noted to be intact. The cavity was then ablated for 120 seconds. Hemostasis was adequate. All instruments were removed. The examination was repeated and the uterus was noted to be firm. Sponge, and needle counts were correct. The estimated blood loss for the procedure was 20 cc. The estimated fluid deficit loss 0 cc. The patient was awakened from her anesthetic without difficulty. She was returned to the supine position and and transported to the recovery room in stable condition. The endocervical curettings, endometrial resections, and endometrial curettings were sent to pathology.  Followup instructions:  The patient will return to see Dr. Raphael Gibney in 2 weeks. She was given a copy of the postoperative instructions for patients who've undergone hysteroscopy.  Discharge medications:  Percocet one tablet every 4 hours as needed for severe pain.  Gildardo Cranker, M.D.

## 2014-03-30 NOTE — Anesthesia Procedure Notes (Signed)
Procedure Name: LMA Insertion Date/Time: 03/30/2014 11:40 AM Performed by: Tobin Chad Pre-anesthesia Checklist: Patient identified, Patient being monitored, Timeout performed, Emergency Drugs available and Suction available Patient Re-evaluated:Patient Re-evaluated prior to inductionOxygen Delivery Method: Circle system utilized Preoxygenation: Pre-oxygenation with 100% oxygen Intubation Type: IV induction Ventilation: Oral airway inserted - appropriate to patient size and Mask ventilation without difficulty LMA: LMA inserted LMA Size: 4.0 Number of attempts: 2 (unable to seat & ventilate with Supreme 4, LMA Unique $ seated without difficulty and patinet ventilateded easily.) Airway Equipment and Method: Patient positioned with wedge pillow Placement Confirmation: positive ETCO2 and breath sounds checked- equal and bilateral Tube secured with: Tape Dental Injury: Teeth and Oropharynx as per pre-operative assessment

## 2014-03-30 NOTE — Transfer of Care (Signed)
Immediate Anesthesia Transfer of Care Note  Patient: Krysta Domenico  Procedure(s) Performed: Procedure(s): DILATATION & CURETTAGE/HYSTEROSCOPY WITH NOVASURE ABLATION (N/A) DILATATION & CURETTAGE/HYSTEROSCOPY WITH RESECTOCOPE (N/A)  Patient Location: PACU  Anesthesia Type:General  Level of Consciousness: awake, alert  and pateint uncooperative  Airway & Oxygen Therapy: Patient Spontanous Breathing and Patient connected to nasal cannula oxygen  Post-op Assessment: Report given to RN and Post -op Vital signs reviewed and stable  Post vital signs: Reviewed and stable  Last Vitals:  Filed Vitals:   03/30/14 0925  BP: 140/74  Pulse: 68  Temp: [id number] C    Complications: No apparent anesthesia complications

## 2014-03-30 NOTE — Anesthesia Postprocedure Evaluation (Signed)
  Anesthesia Post-op Note  Patient: Kathy Houston  Procedure(s) Performed: Procedure(s): DILATATION & CURETTAGE/HYSTEROSCOPY WITH NOVASURE ABLATION (N/A) DILATATION & CURETTAGE/HYSTEROSCOPY WITH RESECTOCOPE (N/A)  Patient Location: PACU  Anesthesia Type:General  Level of Consciousness: awake, alert  and oriented  Airway and Oxygen Therapy: Patient Spontanous Breathing  Post-op Pain: mild  Post-op Assessment: Post-op Vital signs reviewed, Patient's Cardiovascular Status Stable, Respiratory Function Stable, Patent Airway, No signs of Nausea or vomiting and Pain level controlled  Post-op Vital Signs: Reviewed and stable  Last Vitals:  Filed Vitals:   03/30/14 1339  BP:   Pulse:   Temp: 36.6 C  Resp:     Complications: No apparent anesthesia complications

## 2014-03-30 NOTE — Discharge Instructions (Signed)
DISCHARGE INSTRUCTIONS: HYSTEROSCOPY / ENDOMETRIAL ABLATION The following instructions have been prepared to help you care for yourself upon your return home.  May Remove Scop patch on or before 04/01/2014  May take stool softner while taking narcotic pain medication to prevent constipation.  Drink plenty of water.  Personal hygiene:  Use sanitary pads for vaginal drainage, not tampons.  Shower the day after your procedure.  NO tub baths, pools or Jacuzzis for 2-3 weeks.  Wipe front to back after using the bathroom.  Activity and limitations:  Do NOT drive or operate any equipment for 24 hours. The effects of anesthesia are still present and drowsiness may result.  Do NOT rest in bed all day.  Walking is encouraged.  Walk up and down stairs slowly.  You may resume your normal activity in one to two days or as indicated by your physician.  Sexual activity: NO intercourse for at least 2 weeks after the procedure, or as indicated by your Doctor.  Diet: Eat a light meal as desired this evening. You may resume your usual diet tomorrow.  Return to Work: You may resume your work activities in one to two days or as indicated by Marine scientist.  What to expect after your surgery: Expect to have vaginal bleeding/discharge for 2-3 days and spotting for up to 10 days. It is not unusual to have soreness for up to 1-2 weeks. You may have a slight burning sensation when you urinate for the first day. Mild cramps may continue for a couple of days. You may have a regular period in 2-6 weeks.  Call your doctor for any of the following:  Excessive vaginal bleeding or clotting, saturating and changing one pad every hour.  Inability to urinate 6 hours after discharge from hospital.  Pain not relieved by pain medication.  Fever of 100.4 F or greater.  Unusual vaginal discharge or odor.  [facility] [phone]

## 2014-03-31 ENCOUNTER — Encounter (HOSPITAL_COMMUNITY): Payer: Self-pay | Admitting: Obstetrics and Gynecology

## 2014-12-15 ENCOUNTER — Inpatient Hospital Stay (HOSPITAL_COMMUNITY)
Admission: RE | Admit: 2014-12-15 | Discharge: 2014-12-15 | Disposition: A | Discharge status: Home / Self Care | Payer: 59 | Source: Ambulatory Visit

## 2014-12-15 ENCOUNTER — Encounter (HOSPITAL_COMMUNITY): Payer: Self-pay

## 2014-12-15 HISTORY — DX: Peripheral vascular disease, unspecified: I73.9

## 2014-12-15 NOTE — Pre-Procedure Instructions (Signed)
Kathy Houston  12/15/2014      [facility] [place], [place] - [address] [place] [address] Phone: [phone] Fax: [phone]  [facility] [place] [place] - [id number] [place] [address] Phone: [phone] Fax: [place], [place] - [place] [address] Phone: [phone] Fax: [phone]    Your procedure is scheduled on *12/17/14  Report to [facility] short stay admitting at 800 A.M.  Call this number if you have problems the morning of surgery:  [phone]   Remember:  Do not eat food or drink liquids after midnight.  Take these medicines the morning of surgery with A SIP OF WATER inhaler if needed, nexium, gabapentin,tramadol if needed   STOP all herbel meds, nsaids (aleve,naproxen,advil,ibuprofen) starting today including vitamins,aspirin  How to Manage Your Diabetes Before Surgery   Why is it important to control my blood sugar before and after surgery?   Improving blood sugar levels before and after surgery helps healing and can limit problems.  A way of improving blood sugar control is eating a healthy diet by:  - Eating less sugar and carbohydrates  - Increasing activity/exercise  - Talk with your doctor about reaching your blood sugar goals  High blood sugars (greater than 180 mg/dL) can raise your risk of infections and slow down your recovery so you will need to focus on controlling your diabetes during the weeks before surgery.  Make sure that the doctor who takes care of your diabetes knows about your planned surgery including the date and location.  How do I manage my blood sugars before surgery?   Check your blood sugar at least 4 times a day, 2 days before surgery to make sure that they are not too high or low.   Check your blood sugar the morning of your surgery when you wake up  and every 2               hours until you get to the Short-Stay unit.  If your blood sugar is less than 70 mg/dL, you will need to treat for low blood sugar by:  Treat a low blood sugar (less than 70 mg/dL) with 1/2 cup of clear juice (cranberry or apple), 4 glucose tablets, OR glucose gel.  Recheck blood sugar in 15 minutes after treatment (to make sure it is greater than 70 mg/dL).  If blood sugar is not greater than 70 mg/dL on re-check, call [phone] for further instructions.   Report your blood sugar to the Short-Stay nurse when you get to Short-Stay.  References:  [facility], 2007 "How to Manage your Diabetes Before and After Surgery".  What do I do about my diabetes medications?   Do not take oral diabetes medicines (pills) the morning of surgery(metformin).     Do not wear jewelry, make-up or nail polish.  Do not wear lotions, powders, or perfumes.  You may wear deodorant.  Do not shave 48 hours prior to surgery.  Men may shave face and neck.  Do not bring valuables to the hospital.  [facility] is not responsible for any belongings or valuables.  Contacts, dentures or bridgework may not be worn into surgery.  Leave your suitcase in the car.  After surgery it may be brought to your room.  For patients admitted to the hospital, discharge time will be determined  by your treatment team.  Patients discharged the day of surgery will not be allowed to drive home.   Name and phone number of your driver:    Special instructions:   Special Instructions: [place] - Preparing for Surgery  Before surgery, you can play an important role.  Because skin is not sterile, your skin needs to be as free of germs as possible.  You can reduce the number of germs on you skin by washing with CHG (chlorahexidine gluconate) soap before surgery.  CHG is an antiseptic cleaner which kills germs and bonds with the skin to continue killing germs even after  washing.  Please DO NOT use if you have an allergy to CHG or antibacterial soaps.  If your skin becomes reddened/irritated stop using the CHG and inform your nurse when you arrive at Short Stay.  Do not shave (including legs and underarms) for at least 48 hours prior to the first CHG shower.  You may shave your face.  Please follow these instructions carefully:   1.  Shower with CHG Soap the night before surgery and the morning of Surgery.  2.  If you choose to wash your hair, wash your hair first as usual with your normal shampoo.  3.  After you shampoo, rinse your hair and body thoroughly to remove the Shampoo.  4.  Use CHG as you would any other liquid soap.  You can apply chg directly  to the skin and wash gently with scrungie or a clean washcloth.  5.  Apply the CHG Soap to your body ONLY FROM THE NECK DOWN.  Do not use on open wounds or open sores.  Avoid contact with your eyes ears, mouth and genitals (private parts).  Wash genitals (private parts)       with your normal soap.  6.  Wash thoroughly, paying special attention to the area where your surgery will be performed.  7.  Thoroughly rinse your body with warm water from the neck down.  8.  DO NOT shower/wash with your normal soap after using and rinsing off the CHG Soap.  9.  Pat yourself dry with a clean towel.            10.  Wear clean pajamas.            11.  Place clean sheets on your bed the night of your first shower and do not sleep with pets.  Day of Surgery  Do not apply any lotions/deodorants the morning of surgery.  Please wear clean clothes to the hospital/surgery center.  Please read over the following fact sheets that you were given. Pain Booklet, Coughing and Deep Breathing and Surgical Site Infection Prevention

## 2014-12-16 MED ORDER — CLINDAMYCIN PHOSPHATE 600 MG/50ML IV SOLN
600.0000 mg | Freq: Four times a day (QID) | INTRAVENOUS | Status: DC
  Administered 2014-12-17: 600 mg via INTRAVENOUS
  Filled 2014-12-16: qty 50

## 2014-12-17 ENCOUNTER — Encounter (HOSPITAL_COMMUNITY): Payer: Self-pay | Admitting: *Deleted

## 2014-12-17 ENCOUNTER — Ambulatory Visit (HOSPITAL_COMMUNITY): Payer: 59 | Admitting: Certified Registered Nurse Anesthetist

## 2014-12-17 ENCOUNTER — Encounter (HOSPITAL_COMMUNITY)
Admission: RE | Disposition: A | Discharge status: Home / Self Care | Payer: Self-pay | Source: Ambulatory Visit | Attending: Oral Surgery

## 2014-12-17 ENCOUNTER — Ambulatory Visit (HOSPITAL_COMMUNITY)
Admission: RE | Admit: 2014-12-17 | Discharge: 2014-12-17 | Disposition: A | Discharge status: Home / Self Care | Payer: 59 | Source: Ambulatory Visit | Attending: Oral Surgery | Admitting: Oral Surgery

## 2014-12-17 DIAGNOSIS — M549 Dorsalgia, unspecified: Secondary | ICD-10-CM | POA: Insufficient documentation

## 2014-12-17 DIAGNOSIS — K219 Gastro-esophageal reflux disease without esophagitis: Secondary | ICD-10-CM | POA: Diagnosis not present

## 2014-12-17 DIAGNOSIS — E7800 Pure hypercholesterolemia, unspecified: Secondary | ICD-10-CM | POA: Insufficient documentation

## 2014-12-17 DIAGNOSIS — G8929 Other chronic pain: Secondary | ICD-10-CM | POA: Diagnosis not present

## 2014-12-17 DIAGNOSIS — Z880 Allergy status to penicillin: Secondary | ICD-10-CM | POA: Diagnosis not present

## 2014-12-17 DIAGNOSIS — Z86718 Personal history of other venous thrombosis and embolism: Secondary | ICD-10-CM | POA: Diagnosis not present

## 2014-12-17 DIAGNOSIS — I10 Essential (primary) hypertension: Secondary | ICD-10-CM | POA: Insufficient documentation

## 2014-12-17 DIAGNOSIS — E119 Type 2 diabetes mellitus without complications: Secondary | ICD-10-CM | POA: Insufficient documentation

## 2014-12-17 DIAGNOSIS — G4730 Sleep apnea, unspecified: Secondary | ICD-10-CM | POA: Insufficient documentation

## 2014-12-17 DIAGNOSIS — K029 Dental caries, unspecified: Secondary | ICD-10-CM | POA: Insufficient documentation

## 2014-12-17 DIAGNOSIS — Z6841 Body Mass Index (BMI) 40.0 and over, adult: Secondary | ICD-10-CM | POA: Diagnosis not present

## 2014-12-17 DIAGNOSIS — Z888 Allergy status to other drugs, medicaments and biological substances status: Secondary | ICD-10-CM | POA: Diagnosis not present

## 2014-12-17 DIAGNOSIS — F329 Major depressive disorder, single episode, unspecified: Secondary | ICD-10-CM | POA: Diagnosis not present

## 2014-12-17 DIAGNOSIS — I739 Peripheral vascular disease, unspecified: Secondary | ICD-10-CM | POA: Diagnosis not present

## 2014-12-17 HISTORY — PX: MULTIPLE EXTRACTIONS WITH ALVEOLOPLASTY: SHX5342

## 2014-12-17 LAB — BASIC METABOLIC PANEL
Anion gap: 11 (ref 5–15)
BUN: 20 mg/dL (ref 6–20)
CO2: 28 mmol/L (ref 22–32)
Calcium: 9.4 mg/dL (ref 8.9–10.3)
Chloride: 98 mmol/L — ABNORMAL LOW (ref 101–111)
Creatinine, Ser: 1.26 mg/dL — ABNORMAL HIGH (ref 0.44–1.00)
GFR calc non Af Amer: 47 mL/min — ABNORMAL LOW (ref >60)
GFR, EST AFRICAN AMERICAN: 55 mL/min — AB (ref >60)
GLUCOSE: 156 mg/dL — AB (ref 65–99)
Potassium: 4.3 mmol/L (ref 3.5–5.1)
SODIUM: 137 mmol/L (ref 135–145)

## 2014-12-17 LAB — GLUCOSE, CAPILLARY
GLUCOSE-CAPILLARY: 156 mg/dL — AB (ref 65–99)
Glucose-Capillary: 160 mg/dL — ABNORMAL HIGH (ref 65–99)

## 2014-12-17 LAB — CBC
HCT: 34.6 % — ABNORMAL LOW (ref 36.0–46.0)
Hemoglobin: 11.3 g/dL — ABNORMAL LOW (ref 12.0–15.0)
MCH: 30.1 pg (ref 26.0–34.0)
MCHC: 32.7 g/dL (ref 30.0–36.0)
MCV: 92.3 fL (ref 78.0–100.0)
PLATELETS: 305 10*3/uL (ref 150–400)
RBC: 3.75 MIL/uL — ABNORMAL LOW (ref 3.87–5.11)
RDW: 14.6 % (ref 11.5–15.5)
WBC: 9.5 10*3/uL (ref 4.0–10.5)

## 2014-12-17 SURGERY — MULTIPLE EXTRACTION WITH ALVEOLOPLASTY
Anesthesia: General | Site: Mouth

## 2014-12-17 MED ORDER — LACTATED RINGERS IV SOLN
INTRAVENOUS | Status: DC
Start: 2014-12-17 — End: 2014-12-17
  Administered 2014-12-17: 09:00:00 via INTRAVENOUS

## 2014-12-17 MED ORDER — ONDANSETRON HCL 4 MG/2ML IJ SOLN
INTRAMUSCULAR | Status: DC | PRN
  Administered 2014-12-17: 4 mg via INTRAVENOUS

## 2014-12-17 MED ORDER — PHENYLEPHRINE 40 MCG/ML (10ML) SYRINGE FOR IV PUSH (FOR BLOOD PRESSURE SUPPORT)
PREFILLED_SYRINGE | INTRAVENOUS | Status: AC
  Filled 2014-12-17: qty 10

## 2014-12-17 MED ORDER — DIPHENHYDRAMINE HCL 50 MG/ML IJ SOLN
12.5000 mg | Freq: Once | INTRAMUSCULAR | Status: AC
  Administered 2014-12-17: 12.5 mg via INTRAVENOUS
  Filled 2014-12-17: qty 0.25
  Filled 2014-12-17: qty 1

## 2014-12-17 MED ORDER — HYDROCODONE-ACETAMINOPHEN 5-325 MG PO TABS: 1.0000 | ORAL_TABLET | Freq: Four times a day (QID) | ORAL | Status: AC | PRN

## 2014-12-17 MED ORDER — MIDAZOLAM HCL 5 MG/5ML IJ SOLN
INTRAMUSCULAR | Status: DC | PRN
  Administered 2014-12-17: 2 mg via INTRAVENOUS

## 2014-12-17 MED ORDER — HYDROMORPHONE HCL 1 MG/ML IJ SOLN
INTRAMUSCULAR | Status: AC
  Filled 2014-12-17: qty 1

## 2014-12-17 MED ORDER — PHENYLEPHRINE HCL 10 MG/ML IJ SOLN
INTRAMUSCULAR | Status: DC | PRN
  Administered 2014-12-17: 80 ug via INTRAVENOUS
  Administered 2014-12-17: 120 ug via INTRAVENOUS
  Administered 2014-12-17: 80 ug via INTRAVENOUS
  Administered 2014-12-17: 120 ug via INTRAVENOUS
  Administered 2014-12-17: 80 ug via INTRAVENOUS
  Administered 2014-12-17: 40 ug via INTRAVENOUS

## 2014-12-17 MED ORDER — OXYMETAZOLINE HCL 0.05 % NA SOLN
NASAL | Status: DC | PRN
  Administered 2014-12-17: 1 via NASAL

## 2014-12-17 MED ORDER — OXYCODONE HCL 5 MG PO TABS: 5.0000 mg | ORAL_TABLET | Freq: Once | ORAL | Status: DC | PRN

## 2014-12-17 MED ORDER — SCOPOLAMINE 1 MG/3DAYS TD PT72
MEDICATED_PATCH | TRANSDERMAL | Status: AC
  Filled 2014-12-17: qty 1

## 2014-12-17 MED ORDER — ONDANSETRON HCL 4 MG/2ML IJ SOLN: 4.0000 mg | Freq: Four times a day (QID) | INTRAMUSCULAR | Status: DC | PRN

## 2014-12-17 MED ORDER — LIDOCAINE-EPINEPHRINE 2 %-1:100000 IJ SOLN
INTRAMUSCULAR | Status: DC | PRN
  Administered 2014-12-17: 13 mL via INTRADERMAL

## 2014-12-17 MED ORDER — 0.9 % SODIUM CHLORIDE (POUR BTL) OPTIME
TOPICAL | Status: DC | PRN
  Administered 2014-12-17: 1000 mL

## 2014-12-17 MED ORDER — NALOXONE HCL 0.4 MG/ML IJ SOLN
INTRAMUSCULAR | Status: AC
  Filled 2014-12-17: qty 1

## 2014-12-17 MED ORDER — LIDOCAINE HCL (CARDIAC) 20 MG/ML IV SOLN
INTRAVENOUS | Status: DC | PRN
  Administered 2014-12-17: 40 mg via INTRAVENOUS

## 2014-12-17 MED ORDER — FENTANYL CITRATE (PF) 100 MCG/2ML IJ SOLN
INTRAMUSCULAR | Status: DC | PRN
  Administered 2014-12-17: 100 ug via INTRAVENOUS

## 2014-12-17 MED ORDER — SCOPOLAMINE 1 MG/3DAYS TD PT72
1.0000 | MEDICATED_PATCH | TRANSDERMAL | Status: DC
  Administered 2014-12-17: 1.5 mg via TRANSDERMAL
  Filled 2014-12-17: qty 1

## 2014-12-17 MED ORDER — HYDROMORPHONE HCL 1 MG/ML IJ SOLN
0.2500 mg | INTRAMUSCULAR | Status: DC | PRN
  Administered 2014-12-17: 0.5 mg via INTRAVENOUS

## 2014-12-17 MED ORDER — OXYMETAZOLINE HCL 0.05 % NA SOLN
NASAL | Status: DC | PRN
Start: 2014-12-17 — End: 2014-12-17
  Administered 2014-12-17: 1

## 2014-12-17 MED ORDER — MIDAZOLAM HCL 2 MG/2ML IJ SOLN
INTRAMUSCULAR | Status: AC
  Filled 2014-12-17: qty 2

## 2014-12-17 MED ORDER — SODIUM CHLORIDE 0.9 % IR SOLN
Status: DC | PRN
  Administered 2014-12-17: 1000 mL

## 2014-12-17 MED ORDER — LIDOCAINE 5 % EX OINT
TOPICAL_OINTMENT | CUTANEOUS | Status: DC | PRN
  Administered 2014-12-17: 1 via TOPICAL

## 2014-12-17 MED ORDER — OXYCODONE HCL 5 MG/5ML PO SOLN: 5.0000 mg | Freq: Once | ORAL | Status: DC | PRN

## 2014-12-17 MED ORDER — SUCCINYLCHOLINE CHLORIDE 20 MG/ML IJ SOLN
INTRAMUSCULAR | Status: DC | PRN
  Administered 2014-12-17: 100 mg via INTRAVENOUS

## 2014-12-17 MED ORDER — PROPOFOL 10 MG/ML IV BOLUS
INTRAVENOUS | Status: DC | PRN
  Administered 2014-12-17: 170 mg via INTRAVENOUS

## 2014-12-17 MED ORDER — FENTANYL CITRATE (PF) 250 MCG/5ML IJ SOLN
INTRAMUSCULAR | Status: AC
  Filled 2014-12-17: qty 5

## 2014-12-17 SURGICAL SUPPLY — 29 items
BUR CROSS CUT FISSURE 1.6 (BURR) ×2 IMPLANT
BUR CROSS CUT FISSURE 1.6MM (BURR) ×1
BUR EGG ELITE 4.0 (BURR) ×1 IMPLANT
BUR EGG ELITE 4.0MM (BURR) ×1
CANISTER SUCTION 2500CC (MISCELLANEOUS) ×3 IMPLANT
COVER SURGICAL LIGHT HANDLE (MISCELLANEOUS) ×3 IMPLANT
CRADLE DONUT ADULT HEAD (MISCELLANEOUS) ×3 IMPLANT
FLUID NSS /IRRIG 1000 ML XXX (MISCELLANEOUS) ×3 IMPLANT
GAUZE PACKING FOLDED 2  STR (GAUZE/BANDAGES/DRESSINGS) ×2
GAUZE PACKING FOLDED 2 STR (GAUZE/BANDAGES/DRESSINGS) ×1 IMPLANT
GLOVE BIO SURGEON STRL SZ 6.5 (GLOVE) ×2 IMPLANT
GLOVE BIO SURGEON STRL SZ7.5 (GLOVE) ×3 IMPLANT
GLOVE BIO SURGEONS STRL SZ 6.5 (GLOVE) ×1
GLOVE BIOGEL PI IND STRL 7.0 (GLOVE) ×1 IMPLANT
GLOVE BIOGEL PI INDICATOR 7.0 (GLOVE) ×2
GOWN STRL REUS W/ TWL LRG LVL3 (GOWN DISPOSABLE) ×1 IMPLANT
GOWN STRL REUS W/ TWL XL LVL3 (GOWN DISPOSABLE) ×1 IMPLANT
GOWN STRL REUS W/TWL LRG LVL3 (GOWN DISPOSABLE) ×3
GOWN STRL REUS W/TWL XL LVL3 (GOWN DISPOSABLE) ×3
KIT BASIN OR (CUSTOM PROCEDURE TRAY) ×3 IMPLANT
KIT ROOM TURNOVER OR (KITS) ×3 IMPLANT
NEEDLE 22X1 1/2 (OR ONLY) (NEEDLE) ×6 IMPLANT
NS IRRIG 1000ML POUR BTL (IV SOLUTION) ×3 IMPLANT
PAD ARMBOARD 7.5X6 YLW CONV (MISCELLANEOUS) ×3 IMPLANT
SUT CHROMIC 3 0 PS 2 (SUTURE) ×6 IMPLANT
SYR CONTROL 10ML LL (SYRINGE) ×3 IMPLANT
TRAY ENT MC OR (CUSTOM PROCEDURE TRAY) ×3 IMPLANT
TUBING IRRIGATION (MISCELLANEOUS) ×3 IMPLANT
YANKAUER SUCT BULB TIP NO VENT (SUCTIONS) ×3 IMPLANT

## 2014-12-17 NOTE — Op Note (Signed)
Kathy Houston, Kathy Houston              ACCOUNT NO.:  [id number]  MEDICAL RECORD NO.:  [id number]  LOCATION:  [facility]                         FACILITY:  [facility]  PHYSICIAN:  Gae Bon, M.D.  DATE OF BIRTH:  03/05/1960  DATE OF PROCEDURE:  12/17/2014 DATE OF DISCHARGE:  12/17/2014                              OPERATIVE REPORT   PREOPERATIVE DIAGNOSIS:  Nonrestorable teeth secondary to dental caries, #5, 9, 10, 12, 18, 20.  POSTOPERATIVE DIAGNOSIS:  Nonrestorable teeth secondary to dental caries, #5, 9, 10, 12, 18, 20.  PROCEDURE:  Extraction of teeth #5, 9, 10, 12, 18, and 20.  SURGEON:  Gae Bon, M.D.  ANESTHESIA:  Dr. Marcie Bal, nasal intubation.  DESCRIPTION OF PROCEDURE:  The patient was taken to the operating room and placed on the table in supine position.  General anesthesia was administered intravenously and a nasal endotracheal tube was placed and secured.  The eyes were protected, and the patient was draped for the procedure.  Time-out was performed.  The posterior pharynx was suctioned.  A throat pack was placed.  The 2% lidocaine with 1:100,000 epinephrine was infiltrated in the inferior alveolar block on the left side and then buccal and palatal infiltration in the right and left maxilla, total of 13 mL was utilized.  A bite block was placed in the right side of the mouth and a sweetheart retractor was used to retract the tongue.  A #15 blade was used to make incision around teeth #18 and 20 in the mandible and around teeth #9, 10 and 12 in the maxilla.  The periosteum was reflected from around these teeth.  The teeth were elevated with a 301 elevator and extracted with dental forceps.  The sockets were curetted and irrigated and closed with 3-0 chromic.  Then, the sweetheart retractor and bite block were repositioned to the other side of the mouth, and the 15-blade was used to make an incision around tooth #5.  The periosteum was reflected with a periosteal  elevator. Bone was removed around this tooth with a Stryker handpiece under irrigation and the tooth was elevated with a 301 elevator and removed with the rongeur.  The sockets were curetted, irrigated and closed with 3-0 chromic.  The oral cavity was inspected and suctioned and the throat pack was removed.  The patient was awakened, taken to the recovery room, breathing spontaneously in good condition.  ESTIMATED BLOOD LOSS:  Minimum.  COMPLICATIONS:  None.  SPECIMENS:  None.     Gae Bon, M.D.     SMJ/MEDQ  D:  12/17/2014  T:  12/17/2014  Job:  [id number]

## 2014-12-17 NOTE — Transfer of Care (Signed)
Immediate Anesthesia Transfer of Care Note  Patient: Kathy Houston  Procedure(s) Performed: Procedure(s): MULTIPLE EXTRACTIONS OF TEETH 5, 9, 10, 12, 18 AND 20 WITH AVELOPLASTY (N/A)  Patient Location: PACU  Anesthesia Type:General  Level of Consciousness: awake, alert , oriented and patient cooperative  Airway & Oxygen Therapy: Patient Spontanous Breathing and Patient connected to nasal cannula oxygen  Post-op Assessment: Report given to RN and Post -op Vital signs reviewed and stable  Post vital signs: Reviewed and stable  Last Vitals:  Filed Vitals:   12/17/14 0916  BP: 118/52  Pulse: 74  Temp: 36.6 C  Resp: 16    Complications: No apparent anesthesia complications

## 2014-12-17 NOTE — Progress Notes (Signed)
Pt c/o itching from chg wipes. Dr Marcie Bal called new orders noted pt also encouraged to use wash cloth to remove

## 2014-12-17 NOTE — Op Note (Signed)
12/17/2014  9:53 AM  PATIENT:  Kathy Houston  54 y.o. female  PRE-OPERATIVE DIAGNOSIS:  non restorable teeth 5, 9, 10, 12, 18, 20  POST-OPERATIVE DIAGNOSIS:  SAME  PROCEDURE:  Procedure(s): MULTIPLE EXTRACTIONS OF TEETH 5, 9, 10, 12, 18 AND 20   SURGEON:  Surgeon(s): Diona Browner, DDS  ANESTHESIA:   local and general  EBL:  minimal  DRAINS: none   SPECIMEN:  No Specimen  COUNTS:  YES  PLAN OF CARE: Discharge to home after PACU  PATIENT DISPOSITION:  PACU - hemodynamically stable.   PROCEDURE DETAILS: Dictation # [id number]  Gae Bon, DMD 12/17/2014 9:53 AM

## 2014-12-17 NOTE — Anesthesia Preprocedure Evaluation (Signed)
Anesthesia Evaluation  Patient identified by MRN, date of birth, ID band Patient awake    Reviewed: Allergy & Precautions, NPO status , Patient's Chart, lab work & pertinent test results  History of Anesthesia Complications (+) PONV and history of anesthetic complications  Airway Mallampati: II   Neck ROM: full    Dental   Pulmonary sleep apnea ,    breath sounds clear to auscultation       Cardiovascular hypertension, + Peripheral Vascular Disease   Rhythm:regular Rate:Normal     Neuro/Psych  Headaches, Depression    GI/Hepatic GERD  ,  Endo/Other  diabetes, Type 2Morbid obesity  Renal/GU      Musculoskeletal   Abdominal   Peds  Hematology   Anesthesia Other Findings   Reproductive/Obstetrics                             Anesthesia Physical Anesthesia Plan  ASA: II  Anesthesia Plan: General   Post-op Pain Management:    Induction: Intravenous  Airway Management Planned: Nasal ETT  Additional Equipment:   Intra-op Plan:   Post-operative Plan: Extubation in OR  Informed Consent: I have reviewed the patients History and Physical, chart, labs and discussed the procedure including the risks, benefits and alternatives for the proposed anesthesia with the patient or authorized representative who has indicated his/her understanding and acceptance.     Plan Discussed with: CRNA, Anesthesiologist and Surgeon  Anesthesia Plan Comments:         Anesthesia Quick Evaluation

## 2014-12-17 NOTE — H&P (Signed)
HISTORY AND PHYSICAL  Kathy Houston is a 54 y.o. female patient with CC: painful decayed teeth  No diagnosis found.  Past Medical History  Diagnosis Date  . Hypertension   . Diabetes mellitus   . Chronic back pain   . Hypercholesteremia   . Vertigo   . Blood clot in vein   . Ruptured disk     Lower lumbar  . Headache   . GERD (gastroesophageal reflux disease)   . Depression     no treatment  . Shortness of breath dyspnea     on exertion  . PONV (postoperative nausea and vomiting)   . Peripheral vascular disease ([place])     dvt  awhile ago  . Sleep apnea     cpap- need to reset not used in awhile    Current Facility-Administered Medications  Medication Dose Route Frequency Provider Last Rate Last Dose  . clindamycin (CLEOCIN) IVPB 600 mg  600 mg Intravenous 4 times per day Diona Browner, DDS      . diphenhydrAMINE (BENADRYL) injection 12.5 mg  12.5 mg Intravenous Once Albertha Ghee, MD      . lactated ringers infusion   Intravenous Continuous Albertha Ghee, MD       Allergies  Allergen Reactions  . Penicillins Itching    Has patient had a PCN reaction causing immediate rash, facial/tongue/throat swelling, SOB or lightheadedness with hypotension: No Has patient had a PCN reaction causing severe rash involving mucus membranes or skin necrosis: No Has patient had a PCN reaction that required hospitalization No Has patient had a PCN reaction occurring within the last 10 years: No If all of the above answers are "NO", then may proceed with Cephalosporin use.    Active Problems:   * No active hospital problems. *  Vitals: Last menstrual period 01/03/2014. Lab results:No results found for this or any previous visit (from the past 39 hour(s)). Radiology Results: No results found. General appearance: alert, cooperative and morbidly obese Head: Normocephalic, without obvious abnormality, atraumatic Eyes: negative Nose: Nares normal. Septum midline. Mucosa normal. No drainage  or sinus tenderness. Throat: Dental caries teeth # 5, 9, 10, 12, 18, 20. Pharynx clear. Neck: no adenopathy, supple, symmetrical, trachea midline and thyroid not enlarged, symmetric, no tenderness/mass/nodules Resp: clear to auscultation bilaterally Cardio: regular rate and rhythm, S1, S2 normal, no murmur, click, rub or gallop  Assessment:Nonrestorable teeth secondary to dental caries.  Plan: Dental extractions with GA. Day surgery.   Gae Bon 12/17/2014

## 2014-12-17 NOTE — Anesthesia Procedure Notes (Signed)
Procedure Name: Intubation Date/Time: 12/17/2014 9:35 AM Performed by: Salli Quarry [place] Winsor Pre-anesthesia Checklist: Patient identified, Emergency Drugs available, Suction available and Patient being monitored Patient Re-evaluated:Patient Re-evaluated prior to inductionOxygen Delivery Method: Circle system utilized Preoxygenation: Pre-oxygenation with 100% oxygen Intubation Type: IV induction Ventilation: Mask ventilation without difficulty Laryngoscope Size: Mac and 3 Grade View: Grade I Tube type: Oral Nasal Tubes: Right, Nasal Rae, Magill forceps- large, utilized and Nasal prep performed Tube size: 7.0 mm Number of attempts: 1 Placement Confirmation: ETT inserted through vocal cords under direct vision,  positive ETCO2 and breath sounds checked- equal and bilateral

## 2014-12-18 LAB — HEMOGLOBIN A1C
HEMOGLOBIN A1C: 7.7 % — AB (ref 4.8–5.6)
MEAN PLASMA GLUCOSE: 174 mg/dL

## 2014-12-20 ENCOUNTER — Encounter (HOSPITAL_COMMUNITY): Payer: Self-pay | Admitting: Oral Surgery

## 2014-12-20 NOTE — Anesthesia Postprocedure Evaluation (Signed)
Anesthesia Post Note  Patient: Kathy Houston  Procedure(s) Performed: Procedure(s) (LRB): MULTIPLE EXTRACTIONS OF TEETH 5, 9, 10, 12, 18 AND 20 WITH AVELOPLASTY (N/A)  Patient location during evaluation: PACU Anesthesia Type: General Level of consciousness: awake and alert and patient cooperative Pain management: pain level controlled Vital Signs Assessment: post-procedure vital signs reviewed and stable Respiratory status: spontaneous breathing and respiratory function stable Cardiovascular status: stable Anesthetic complications: no    Last Vitals:  Filed Vitals:   12/17/14 1045 12/17/14 1100  BP: 125/64 126/70  Pulse: 72 74  Temp:  36.1 C  Resp: 17 17    Last Pain:  Filed Vitals:   12/17/14 1117  PainSc: [place]

## 2015-10-06 ENCOUNTER — Ambulatory Visit (HOSPITAL_BASED_OUTPATIENT_CLINIC_OR_DEPARTMENT_OTHER): Payer: BLUE CROSS/BLUE SHIELD | Attending: Family Medicine | Admitting: Internal Medicine

## 2015-10-06 VITALS — Ht 65.0 in | Wt 284.0 lb

## 2015-10-06 DIAGNOSIS — G4733 Obstructive sleep apnea (adult) (pediatric): Secondary | ICD-10-CM | POA: Insufficient documentation

## 2015-10-06 DIAGNOSIS — G4730 Sleep apnea, unspecified: Secondary | ICD-10-CM | POA: Diagnosis present

## 2015-10-07 ENCOUNTER — Other Ambulatory Visit (HOSPITAL_BASED_OUTPATIENT_CLINIC_OR_DEPARTMENT_OTHER): Payer: Self-pay

## 2015-10-07 DIAGNOSIS — G4733 Obstructive sleep apnea (adult) (pediatric): Secondary | ICD-10-CM

## 2015-10-08 DIAGNOSIS — G4733 Obstructive sleep apnea (adult) (pediatric): Secondary | ICD-10-CM | POA: Diagnosis not present

## 2015-10-08 NOTE — Procedures (Signed)
   Patient Name: Kathy Houston, Kathy Houston Date: 10/06/2015 Gender: Female D.O.B: [birth year] Age (years): 55 Referring Provider: Ricke Hey Height (inches): 65 Interpreting Physician: Baird Lyons MD, ABSM Weight (lbs): 284 RPSGT: Earney Hamburg BMI: 47 MRN: [id number] Neck Size: 16.50 CLINICAL INFORMATION Sleep Study Type: NPSG Indication for sleep study: N/A Epworth Sleepiness Score: 20  SLEEP STUDY TECHNIQUE As per the AASM Manual for the Scoring of Sleep and Associated Events v2.3 (April 2016) with a hypopnea requiring 4% desaturations. The channels recorded and monitored were frontal, central and occipital EEG, electrooculogram (EOG), submentalis EMG (chin), nasal and oral airflow, thoracic and abdominal wall motion, anterior tibialis EMG, snore microphone, electrocardiogram, and pulse oximetry.  MEDICATIONS Patient's medications include: charted for review Medications self-administered by patient during sleep study : PRAVASTATIN, IBUPROFEN.  SLEEP ARCHITECTURE The study was initiated at 10:39:17 PM and ended at 4:49:14 AM. Sleep onset time was 69.2 minutes and the sleep efficiency was 71.2%. The total sleep time was 263.5 minutes. Stage REM latency was 95.5 minutes. The patient spent 0.76% of the night in stage N1 sleep, 82.54% in stage N2 sleep, 0.00% in stage N3 and 16.70% in REM. Alpha intrusion was absent. Supine sleep was 0.00%.  RESPIRATORY PARAMETERS The overall apnea/hypopnea index (AHI) was 6.8 per hour. There were 4 total apneas, including 4 obstructive, 0 central and 0 mixed apneas. There were 26 hypopneas and 2 RERAs. The AHI during Stage REM sleep was 27.3 per hour. AHI while supine was N/A per hour. The mean oxygen saturation was 94.80%. The minimum SpO2 during sleep was 81.00%. Moderate snoring was noted during this study.  CARDIAC DATA The 2 lead EKG demonstrated sinus rhythm. The mean heart rate was 73.51 beats per minute. Other EKG findings  include: None.  LEG MOVEMENT DATA The total PLMS were 0 with a resulting PLMS index of 0.00. Associated arousal with leg movement index was 0.0 .  IMPRESSIONS - Mild obstructive sleep apnea occurred during this study (AHI = 6.8/h). - No significant central sleep apnea occurred during this study (CAI = 0.0/h). - Mild oxygen desaturation was noted during this study (Min O2 = 81.00%). - The patient snored with Moderate snoring volume. - No cardiac abnormalities were noted during this study. - Clinically significant periodic limb movements did not occur during sleep. No significant associated arousals.  DIAGNOSIS - Obstructive Sleep Apnea (327.23 [G47.33 ICD-10]  RECOMMENDATIONS - Very mild obstructive sleep apnea. Return to discuss treatment options. - Avoid alcohol, sedatives and other CNS depressants that may worsen sleep apnea and disrupt normal sleep architecture. - Sleep hygiene should be reviewed to assess factors that may improve sleep quality. - Weight management and regular exercise should be initiated or continued if appropriate.  [Electronically signed] 10/08/2015 03:38 PM  Baird Lyons MD, [place], American Board of Sleep Medicine   NPI: [id number]  [place], American Board of Sleep Medicine  ELECTRONICALLY SIGNED ON:  10/08/2015, 3:37 PM [place] PH: [phone]   FX: [phone] [place]

## 2016-01-06 ENCOUNTER — Other Ambulatory Visit: Payer: Self-pay | Admitting: Gastroenterology

## 2016-01-06 DIAGNOSIS — R1310 Dysphagia, unspecified: Secondary | ICD-10-CM

## 2016-01-12 ENCOUNTER — Other Ambulatory Visit: Payer: BLUE CROSS/BLUE SHIELD

## 2016-01-20 ENCOUNTER — Ambulatory Visit
Admission: RE | Admit: 2016-01-20 | Discharge: 2016-01-20 | Disposition: A | Discharge status: Home / Self Care | Payer: BLUE CROSS/BLUE SHIELD | Source: Ambulatory Visit | Attending: Gastroenterology | Admitting: Gastroenterology

## 2016-01-20 DIAGNOSIS — R1310 Dysphagia, unspecified: Secondary | ICD-10-CM

## 2016-01-24 ENCOUNTER — Other Ambulatory Visit: Payer: Self-pay | Admitting: Gastroenterology

## 2016-01-24 NOTE — Progress Notes (Signed)
Unable to contact pt by phone for pre-op call. Left pre-op instructions on pt's voicemail

## 2016-01-25 ENCOUNTER — Encounter (HOSPITAL_COMMUNITY): Payer: Self-pay | Admitting: *Deleted

## 2016-01-25 ENCOUNTER — Encounter (HOSPITAL_COMMUNITY): Payer: Self-pay | Admitting: Anesthesiology

## 2016-01-25 ENCOUNTER — Ambulatory Visit (HOSPITAL_COMMUNITY)
Admission: RE | Admit: 2016-01-25 | Discharge: 2016-01-25 | Disposition: A | Discharge status: Home / Self Care | Payer: BLUE CROSS/BLUE SHIELD | Source: Ambulatory Visit | Attending: Gastroenterology | Admitting: Gastroenterology

## 2016-01-25 ENCOUNTER — Emergency Department (HOSPITAL_COMMUNITY)
Admission: EM | Admit: 2016-01-25 | Discharge: 2016-01-25 | Disposition: A | Discharge status: Home / Self Care | Payer: BLUE CROSS/BLUE SHIELD | Attending: Emergency Medicine | Admitting: Emergency Medicine

## 2016-01-25 ENCOUNTER — Encounter (HOSPITAL_COMMUNITY)
Admission: RE | Disposition: A | Discharge status: Home / Self Care | Payer: Self-pay | Source: Ambulatory Visit | Attending: Gastroenterology

## 2016-01-25 ENCOUNTER — Encounter (HOSPITAL_COMMUNITY): Payer: Self-pay | Admitting: Family Medicine

## 2016-01-25 DIAGNOSIS — E1165 Type 2 diabetes mellitus with hyperglycemia: Secondary | ICD-10-CM | POA: Diagnosis not present

## 2016-01-25 DIAGNOSIS — Z7982 Long term (current) use of aspirin: Secondary | ICD-10-CM | POA: Insufficient documentation

## 2016-01-25 DIAGNOSIS — Z7984 Long term (current) use of oral hypoglycemic drugs: Secondary | ICD-10-CM | POA: Insufficient documentation

## 2016-01-25 DIAGNOSIS — R739 Hyperglycemia, unspecified: Secondary | ICD-10-CM

## 2016-01-25 DIAGNOSIS — Z539 Procedure and treatment not carried out, unspecified reason: Secondary | ICD-10-CM

## 2016-01-25 DIAGNOSIS — R1012 Left upper quadrant pain: Secondary | ICD-10-CM | POA: Insufficient documentation

## 2016-01-25 DIAGNOSIS — I10 Essential (primary) hypertension: Secondary | ICD-10-CM | POA: Insufficient documentation

## 2016-01-25 LAB — BASIC METABOLIC PANEL
ANION GAP: 12 (ref 5–15)
BUN: 12 mg/dL (ref 6–20)
CALCIUM: 9.7 mg/dL (ref 8.9–10.3)
CO2: 26 mmol/L (ref 22–32)
Chloride: 93 mmol/L — ABNORMAL LOW (ref 101–111)
Creatinine, Ser: 1.31 mg/dL — ABNORMAL HIGH (ref 0.44–1.00)
GFR, EST AFRICAN AMERICAN: 52 mL/min — AB (ref >60)
GFR, EST NON AFRICAN AMERICAN: 45 mL/min — AB (ref >60)
GLUCOSE: 541 mg/dL — AB (ref 65–99)
POTASSIUM: 4.4 mmol/L (ref 3.5–5.1)
SODIUM: 131 mmol/L — AB (ref 135–145)

## 2016-01-25 LAB — POCT I-STAT, CHEM 8
BUN: 13 mg/dL (ref 6–20)
CHLORIDE: 92 mmol/L — AB (ref 101–111)
Calcium, Ion: 1.17 mmol/L (ref 1.15–1.40)
Creatinine, Ser: 1.3 mg/dL — ABNORMAL HIGH (ref 0.44–1.00)
GLUCOSE: 590 mg/dL — AB (ref 65–99)
HCT: 41 % (ref 36.0–46.0)
HEMOGLOBIN: 13.9 g/dL (ref 12.0–15.0)
POTASSIUM: 4.7 mmol/L (ref 3.5–5.1)
SODIUM: 132 mmol/L — AB (ref 135–145)
TCO2: 30 mmol/L (ref 0–100)

## 2016-01-25 LAB — URINALYSIS, ROUTINE W REFLEX MICROSCOPIC
BILIRUBIN URINE: NEGATIVE
Glucose, UA: >=500 mg/dL — AB
HGB URINE DIPSTICK: NEGATIVE
KETONES UR: NEGATIVE mg/dL
NITRITE: NEGATIVE
Protein, ur: NEGATIVE mg/dL
SPECIFIC GRAVITY, URINE: 1.026 (ref 1.005–1.030)
pH: 6 (ref 5.0–8.0)

## 2016-01-25 LAB — CBC
HEMATOCRIT: 38.2 % (ref 36.0–46.0)
HEMOGLOBIN: 12.9 g/dL (ref 12.0–15.0)
MCH: 30.9 pg (ref 26.0–34.0)
MCHC: 33.8 g/dL (ref 30.0–36.0)
MCV: 91.6 fL (ref 78.0–100.0)
Platelets: 231 10*3/uL (ref 150–400)
RBC: 4.17 MIL/uL (ref 3.87–5.11)
RDW: 13.5 % (ref 11.5–15.5)
WBC: 8.3 10*3/uL (ref 4.0–10.5)

## 2016-01-25 LAB — GLUCOSE, CAPILLARY: GLUCOSE-CAPILLARY: 510 mg/dL — AB (ref 65–99)

## 2016-01-25 LAB — CBG MONITORING, ED
GLUCOSE-CAPILLARY: 534 mg/dL — AB (ref 65–99)
GLUCOSE-CAPILLARY: 386 mg/dL — AB (ref 65–99)

## 2016-01-25 SURGERY — CANCELLED PROCEDURE

## 2016-01-25 MED ORDER — SODIUM CHLORIDE 0.9 % IV BOLUS (SEPSIS)
1000.0000 mL | Freq: Once | INTRAVENOUS | Status: AC
  Administered 2016-01-25: 1000 mL via INTRAVENOUS

## 2016-01-25 MED ORDER — ONDANSETRON HCL 4 MG/2ML IJ SOLN
4.0000 mg | Freq: Once | INTRAMUSCULAR | Status: AC
  Administered 2016-01-25: 4 mg via INTRAVENOUS
  Filled 2016-01-25: qty 2

## 2016-01-25 MED ORDER — ONDANSETRON HCL 4 MG/2ML IJ SOLN
INTRAMUSCULAR | Status: AC
  Filled 2016-01-25: qty 2

## 2016-01-25 MED ORDER — INSULIN ASPART 100 UNIT/ML ~~LOC~~ SOLN: 10.0000 [IU] | Freq: Once | SUBCUTANEOUS | Status: DC

## 2016-01-25 MED ORDER — INSULIN ASPART 100 UNIT/ML ~~LOC~~ SOLN
10.0000 [IU] | Freq: Once | SUBCUTANEOUS | Status: AC
  Administered 2016-01-25: 10 [IU] via INTRAVENOUS
  Filled 2016-01-25: qty 1

## 2016-01-25 MED ORDER — LACTATED RINGERS IV SOLN
INTRAVENOUS | Status: DC
  Administered 2016-01-25: 1000 mL via INTRAVENOUS

## 2016-01-25 MED ORDER — GI COCKTAIL ~~LOC~~
30.0000 mL | Freq: Once | ORAL | Status: AC
  Administered 2016-01-25: 30 mL via ORAL
  Filled 2016-01-25: qty 30

## 2016-01-25 MED ORDER — BLOOD GLUCOSE MONITOR KIT: PACK | 0 refills | Status: AC

## 2016-01-25 MED ORDER — SODIUM CHLORIDE 0.9 % IV BOLUS (SEPSIS): 1000.0000 mL | Freq: Once | INTRAVENOUS | Status: DC

## 2016-01-25 MED ORDER — SODIUM CHLORIDE 0.9 % IV SOLN: INTRAVENOUS | Status: DC

## 2016-01-25 MED ORDER — ONDANSETRON HCL 4 MG/2ML IJ SOLN
4.0000 mg | Freq: Once | INTRAMUSCULAR | Status: AC | PRN
  Administered 2016-01-25: 4 mg via INTRAVENOUS

## 2016-01-25 NOTE — ED Triage Notes (Signed)
Pt presents from Endo/Short stay with CBG 590 and nausea. She states nausea began last night after doing the bowel prep. She is A&O and has no other complaints.

## 2016-01-25 NOTE — ED Notes (Signed)
Called to reassess vitals -- no answer

## 2016-01-25 NOTE — ED Provider Notes (Signed)
[facility] DEPT Provider Note   CSN: [id number] Arrival date & time: 01/25/16  1244     History   Chief Complaint Chief Complaint  Patient presents with  . Hyperglycemia    HPI Kathy Houston is a 56 y.o. female.  HPI  56 y.o. female with a hx of HTN, HLD, DM, presents to the Emergency Department today complaining of hyperglycemia. Pt was in Endo/short Stay when she developed nausea likely from bowel prep the night before. CBG showed glucose 590.  Pt DM regiment Metformin 500mg  QD. Notes that when she was bowel prepping she was unable to take her normal home medications. Intermittent nausea and emesis. Occurred today in short stay and check CBG with elevation. Sent to ED for eval. No CP/SOB. MIld LUQ abdominal pain. Rates 7/10. Cramping. Hx same and getting endoscopy for same as outpatient. No numbness/tingling. No other symptoms noted     Past Medical History:  Diagnosis Date  . Blood clot in vein   . Chronic back pain   . Depression    no treatment  . Diabetes mellitus   . GERD (gastroesophageal reflux disease)   . Headache   . Hypercholesteremia   . Hypertension   . Peripheral vascular disease ([place])    dvt  awhile ago  . PONV (postoperative nausea and vomiting)   . Ruptured disk    Lower lumbar  . Shortness of breath dyspnea    on exertion  . Sleep apnea    cpap- need to reset not used in awhile  . Vertigo     Patient Active Problem List   Diagnosis Date Noted  . LBP (low back pain) 07/17/2012    Past Surgical History:  Procedure Laterality Date  . CHOLECYSTECTOMY    . DILATATION & CURRETTAGE/HYSTEROSCOPY WITH RESECTOCOPE N/A 03/30/2014   Procedure: DILATATION & CURETTAGE/HYSTEROSCOPY WITH RESECTOCOPE;  Surgeon: Ena Dawley, MD;  Location: [facility];  Service: Gynecology;  Laterality: N/A;  . DILATION AND CURETTAGE OF UTERUS     x 5  . DILITATION & CURRETTAGE/HYSTROSCOPY WITH NOVASURE ABLATION N/A 03/30/2014   Procedure: DILATATION &  CURETTAGE/HYSTEROSCOPY WITH NOVASURE ABLATION;  Surgeon: Ena Dawley, MD;  Location: [facility];  Service: Gynecology;  Laterality: N/A;  . MULTIPLE EXTRACTIONS WITH ALVEOLOPLASTY N/A 12/17/2014   Procedure: MULTIPLE EXTRACTIONS OF TEETH 5, 9, 10, 12, 18 AND 20 WITH AVELOPLASTY;  Surgeon: Diona Browner, DDS;  Location: [facility];  Service: Oral Surgery;  Laterality: N/A;    OB History    No data available       Home Medications    Prior to Admission medications   Medication Sig Start Date End Date Taking? Authorizing Provider  albuterol (PROVENTIL HFA;VENTOLIN HFA) 108 (90 BASE) MCG/ACT inhaler Inhale 2 puffs into the lungs every 6 (six) hours as needed for wheezing or shortness of breath.    Historical Provider, MD  aspirin EC 81 MG tablet Take 81 mg by mouth daily.    Historical Provider, MD  esomeprazole (NEXIUM) 40 MG capsule Take 40 mg by mouth daily before breakfast.    Historical Provider, MD  gabapentin (NEURONTIN) 300 MG capsule Take 300 mg by mouth 3 (three) times daily.    Historical Provider, MD  HYDROcodone-acetaminophen (NORCO) 5-325 MG tablet Take 1 tablet by mouth every 6 (six) hours as needed for moderate pain. 12/17/14   Diona Browner, DDS  ibuprofen (ADVIL,MOTRIN) 800 MG tablet Take 1 tablet (800 mg total) by mouth 3 (three) times daily. 01/30/13   Ezequiel Essex,  MD  Meclizine HCl (BONINE PO) Take 1 tablet by mouth daily as needed (dizziness). dizziness    Historical Provider, MD  metFORMIN (GLUCOPHAGE) 500 MG tablet Take 500 mg by mouth daily with breakfast.     Historical Provider, MD  oxyCODONE-acetaminophen (ROXICET) 5-325 MG per tablet Take 1 tablet by mouth every 4 (four) hours as needed for severe pain. 03/30/14   Ena Dawley, MD  pravastatin (PRAVACHOL) 20 MG tablet Take 20 mg by mouth daily.    Historical Provider, MD  traMADol (ULTRAM) 50 MG tablet Take 50 mg by mouth every 6 (six) hours as needed for moderate pain or severe pain (pain). For pain    Historical  Provider, MD  valsartan-hydrochlorothiazide (DIOVAN-HCT) 160-25 MG per tablet Take 1 tablet by mouth daily.    Historical Provider, MD    Family History No family history on file.  Social History Social History  Substance Use Topics  . Smoking status: Never Smoker  . Smokeless tobacco: Never Used  . Alcohol use No     Allergies   Chlorhexidine and Penicillins   Review of Systems Review of Systems ROS reviewed and all are negative for acute change except as noted in the HPI.  Physical Exam Updated Vital Signs BP 101/66 (BP Location: Right Arm)   Pulse 83   Temp 98.5 F (36.9 C) (Oral)   Resp 16   LMP 01/03/2014   SpO2 100%   Physical Exam  Constitutional: She is oriented to person, place, and time. Vital signs are normal. She appears well-developed and well-nourished.  Well appearing. NAD  HENT:  Head: Normocephalic and atraumatic.  Right Ear: Hearing normal.  Left Ear: Hearing normal.  Eyes: Conjunctivae and EOM are normal. Pupils are equal, round, and reactive to light.  Neck: Normal range of motion. Neck supple.  Cardiovascular: Normal rate, regular rhythm, normal heart sounds and intact distal pulses.   Pulmonary/Chest: Effort normal and breath sounds normal.  Abdominal: Soft. Normal appearance and bowel sounds are normal. There is tenderness in the left upper quadrant. There is no rigidity, no rebound, no CVA tenderness, no tenderness at McBurney's point and negative Murphy's sign.  Musculoskeletal: Normal range of motion.  Neurological: She is alert and oriented to person, place, and time.  Skin: Skin is warm and dry.  Psychiatric: She has a normal mood and affect. Her speech is normal and behavior is normal. Thought content normal.  Nursing note and vitals reviewed.  ED Treatments / Results  Labs (all labs ordered are listed, but only abnormal results are displayed) Labs Reviewed  BASIC METABOLIC PANEL - Abnormal; Notable for the following:       Result  Value   Sodium 131 (*)    Chloride 93 (*)    Glucose, Bld 541 (*)    Creatinine, Ser 1.31 (*)    GFR calc non Af Amer 45 (*)    GFR calc Af Amer 52 (*)    All other components within normal limits  URINALYSIS, ROUTINE W REFLEX MICROSCOPIC - Abnormal; Notable for the following:    Glucose, UA >=500 (*)    Leukocytes, UA SMALL (*)    Bacteria, UA RARE (*)    Squamous Epithelial / LPF 0-5 (*)    All other components within normal limits  CBG MONITORING, ED - Abnormal; Notable for the following:    Glucose-Capillary 534 (*)    All other components within normal limits  CBC  CBG MONITORING, ED    EKG  EKG Interpretation None      Radiology No results found.  Procedures Procedures (including critical care time)  Medications Ordered in ED Medications  ondansetron (ZOFRAN) injection 4 mg (4 mg Intravenous Given 01/25/16 1316)  insulin aspart (novoLOG) injection 10 Units (10 Units Intravenous Given 01/25/16 1849)  sodium chloride 0.9 % bolus 1,000 mL (1,000 mLs Intravenous New Bag/Given 01/25/16 1842)  ondansetron (ZOFRAN) injection 4 mg (4 mg Intravenous Given 01/25/16 1919)  sodium chloride 0.9 % bolus 1,000 mL (1,000 mLs Intravenous New Bag/Given 01/25/16 1918)  gi cocktail (Maalox,Lidocaine,Donnatal) (30 mLs Oral Given 01/25/16 1918)    Initial Impression / Assessment and Plan / ED Course  I have reviewed the triage vital signs and the nursing notes.  Pertinent labs & imaging results that were available during my care of the patient were reviewed by me and considered in my medical decision making (see chart for details).  Clinical Course    Final Clinical Impressions(s) / ED Diagnoses  {I have reviewed and evaluated the relevant laboratory values.   {I have reviewed the relevant previous healthcare records.  {I obtained HPI from historian.   ED Course:  Assessment: Pt is a 8yF with hx DM2 who presents with hyperglycemia from short stay. Pt scheduled to get endoscopy  upstairs as outpatient and developed hyperglycemia. Did not take home medications due to bowel prep causing N/V. On exam, pt in NAD. Nontoxic/nonseptic appearing. VSS. Afebrile. Lungs CTA. Heart RRR. Abdomen nontender soft. POC Glucose 534. CBC unremarkable. Potassium 4.4. Gap 12. UA unremarkable. Given NS bolus 2L in ED as well as 10U Insulin. Repeat CBG 386. Plan is to DC home with follow up to PCP. Given Rx for Glucometer as patient's apparently broke at home. At time of discharge, Patient is in no acute distress. Vital Signs are stable. Patient is able to ambulate. Patient able to tolerate PO.   Disposition/Plan:  DC Home Additional Verbal discharge instructions given and discussed with patient.  Pt Instructed to f/u with PCP in the next week for evaluation and treatment of symptoms. Return precautions given Pt acknowledges and agrees with plan  Supervising Physician Leo Grosser, MD  Final diagnoses:  Hyperglycemia    New Prescriptions New Prescriptions   No medications on file     Shary Decamp, PA-C 01/25/16 2127    Leo Grosser, MD 01/26/16 [phone]

## 2016-01-25 NOTE — ED Notes (Signed)
Called pt. For repeat vitals three times. Pt did not answer.

## 2016-01-25 NOTE — ED Notes (Signed)
CRITICAL VALUE ALERT  Critical value received:  CBG 541  Date of notification: 01/25/2016  Time of notification:  4935  Critical value read back:Yes.    Nurse who received alert:  Leodis Rains  MD notified (1st page):  Dr. Lita Mains   Time of first page:  1420. No further orders at this time

## 2016-01-25 NOTE — Discharge Instructions (Signed)
Please read and follow all provided instructions.  Your diagnoses today include:  1. Hyperglycemia     Tests performed today include: Vital signs. See below for your results today.   Medications prescribed:  Take as prescribed   Home care instructions:  Follow any educational materials contained in this packet.  Follow-up instructions: Please follow-up with your primary care provider for further evaluation of symptoms and treatment   Return instructions:  Please return to the Emergency Department if you do not get better, if you get worse, or new symptoms OR  - Fever (temperature greater than 101.44F)  - Bleeding that does not stop with holding pressure to the area    -Severe pain (please note that you may be more sore the day after your accident)  - Chest Pain  - Difficulty breathing  - Severe nausea or vomiting  - Inability to tolerate food and liquids  - Passing out  - Skin becoming red around your wounds  - Change in mental status (confusion or lethargy)  - New numbness or weakness    Please return if you have any other emergent concerns.  Additional Information:  Your vital signs today were: BP 101/66 (BP Location: Right Arm)    Pulse 83    Temp 98.5 F (36.9 C) (Oral)    Resp 16    LMP 01/03/2014    SpO2 100%  If your blood pressure (BP) was elevated above 135/85 this visit, please have this repeated by your doctor within one month. ---------------

## 2016-01-25 NOTE — Progress Notes (Signed)
Patient admitted to endo unit. Patient stated that she had not being taking any PO medications due to being unable to swallow them for the past 3 weeks, this includes her Metformin that is scheduled once daily. Patient has a hx of type 2 diabetes and has not been checking her blood sugar due to needing a new meter at home. Blood work drawn while starting the IV and ISTAT run with a result of her blood sugar being 590. Anesthesia MD notified. Finger stick completed with a result of 510. Anesthesia MD notified would like patient moved to ED for treatment of hyperglycemia and case cancelled. Procedural MD notified and agrees with plan to cancel case and transfer patient to ED for treatment.   Patient VSS (see flowsheet). Patient is thirsty, but has been NPO since midnight and has completed bowel prep for colonoscopy. Patient c/o slight nausea but no need for medication at this point. Patient transferred to ED triage for assessment and possible treatment.

## 2016-01-26 LAB — CBG MONITORING, ED: Glucose-Capillary: 516 mg/dL (ref 65–99)

## 2016-05-25 IMAGING — US US TRANSVAGINAL NON-OB
1 series · 13 of 25 positions shown · non-contrast
Comparison: 04/20/2005

CLINICAL DATA: Pelvic pain. Hypertension. Diabetes. According to
the ultrasound worksheet form, the patient's last menstrual period
was 20 years ago.

EXAM:
TRANSABDOMINAL AND TRANSVAGINAL ULTRASOUND OF PELVIS
TECHNIQUE: Both transabdominal and transvaginal ultrasound examinations of the
pelvis were performed. Transabdominal technique was performed for
global imaging of the pelvis including uterus, ovaries, adnexal
regions, and pelvic cul-de-sac. It was necessary to proceed with
endovaginal exam following the transabdominal exam to visualize the
endometrium in uterus.

[Series 1: us transvaginal non-ob · 0.25mm/px · 13 of 44 slices shown]
[im 1/44]
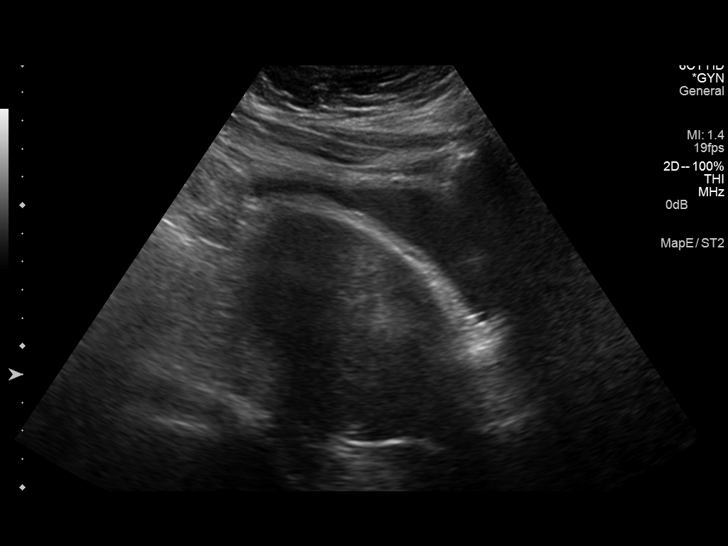
[im 4/44]
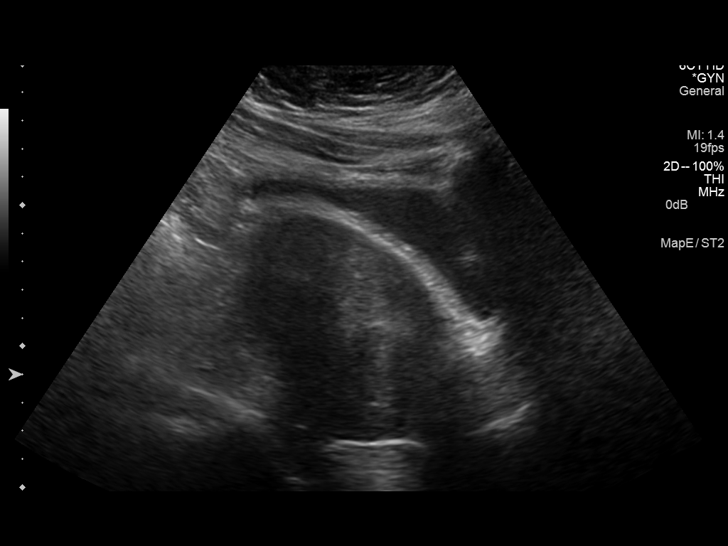
[im 8/44]
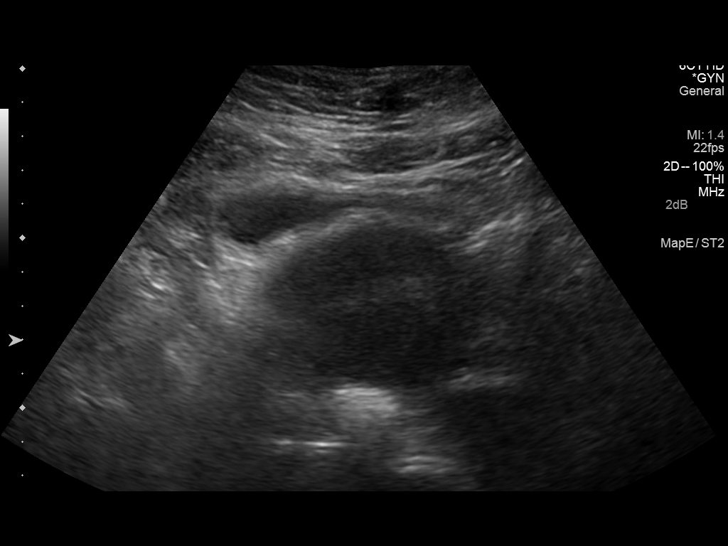
[im 11/44]
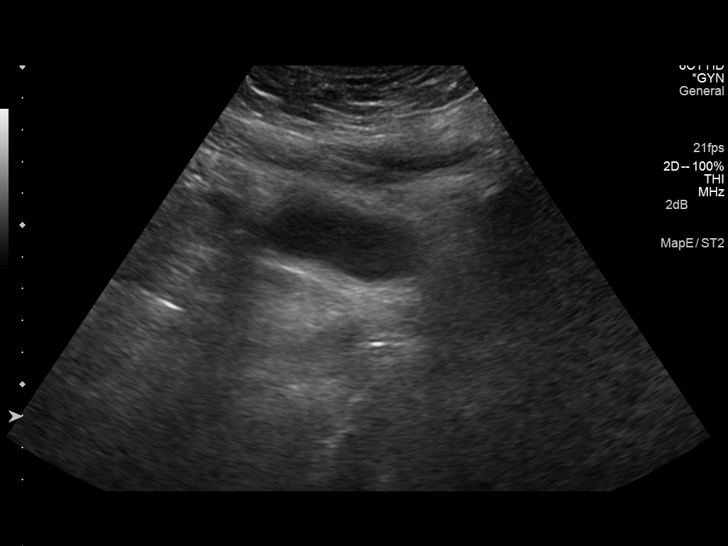
[im 15/44]
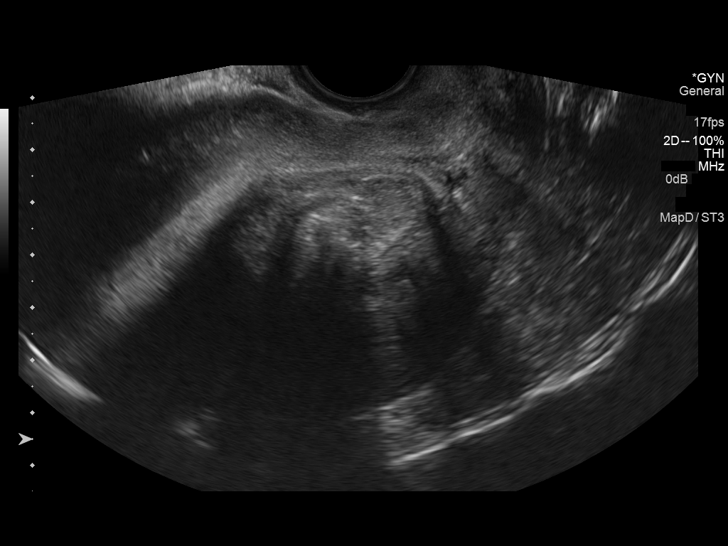
[im 18/44]
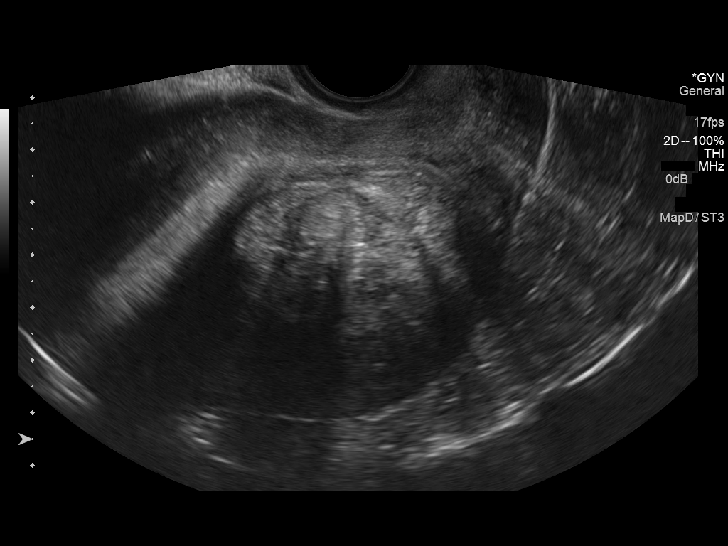
[im 22/44]
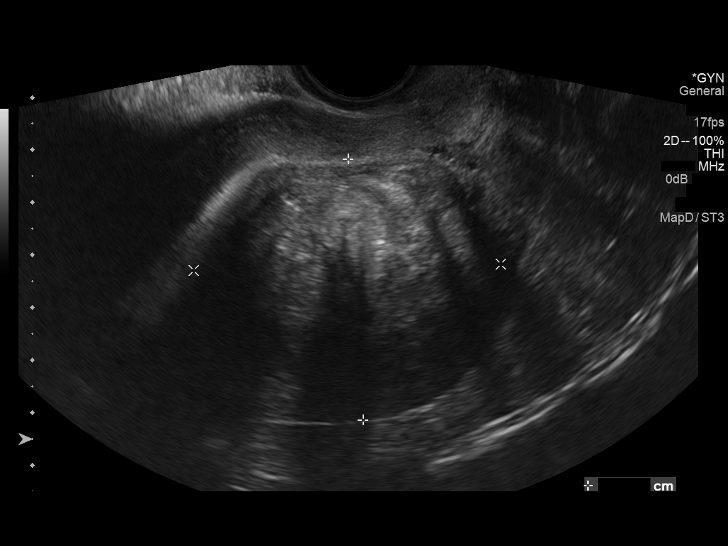
[im 26/44]
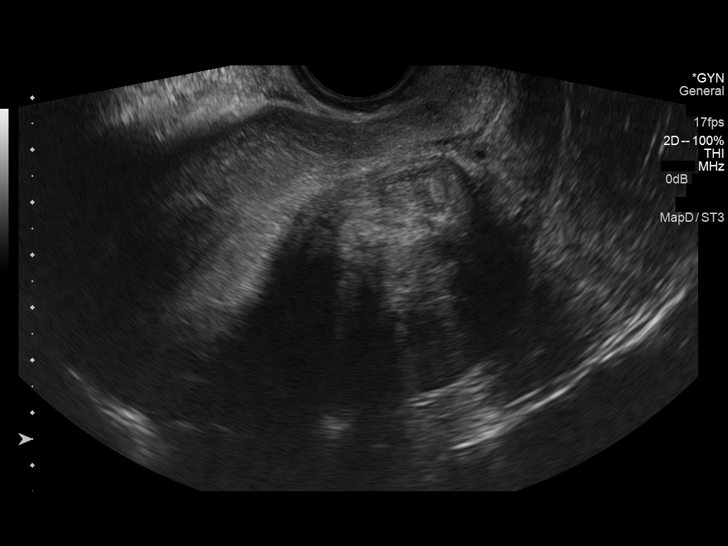
[im 29/44]
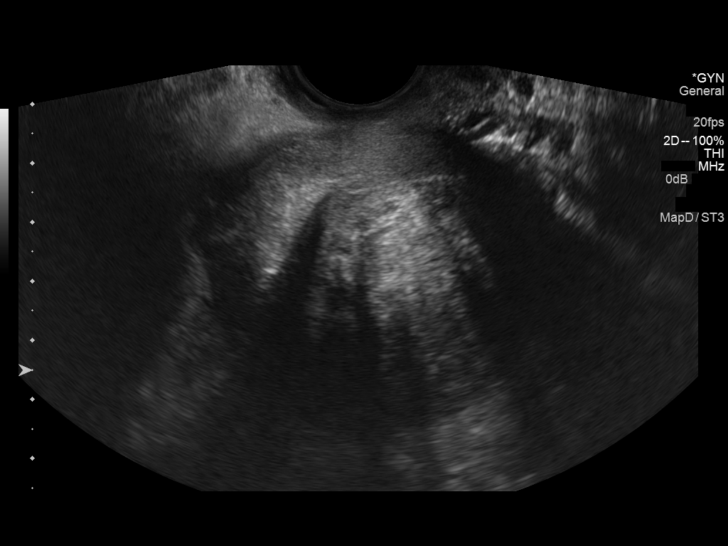
[im 33/44]
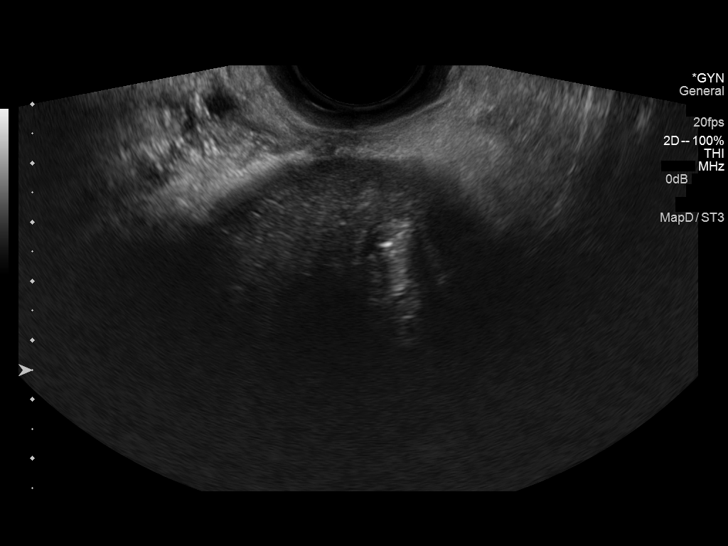
[im 36/44]
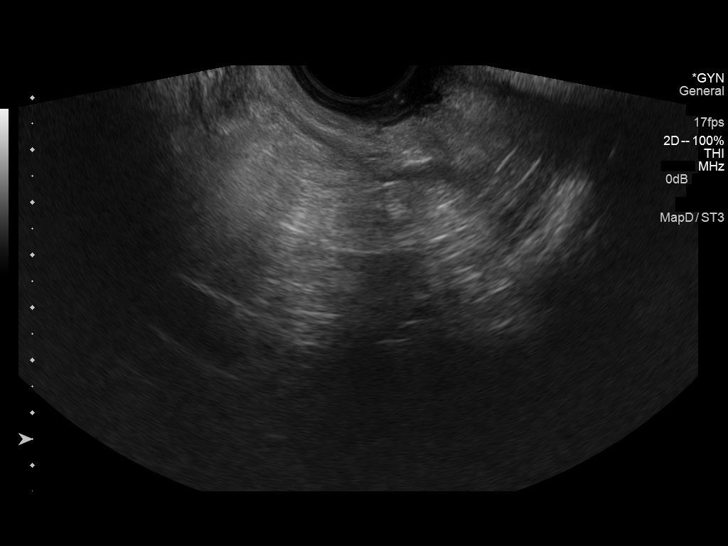
[im 40/44]
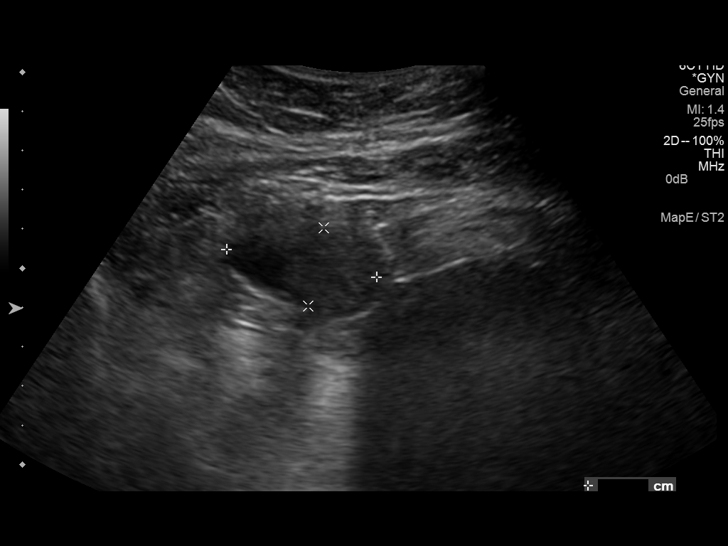
[im 44/44]
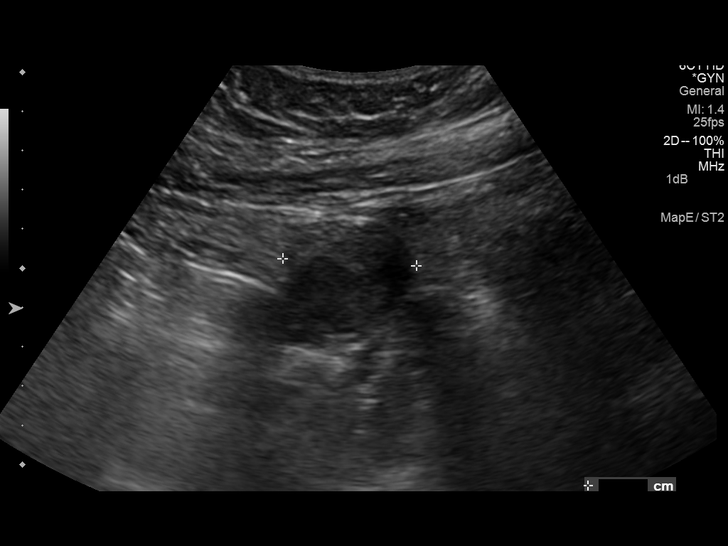

[13 of 25 positions shown; findings below may reference images not displayed]

FINDINGS: Uterus

Measurements: 11.1 by 6.2 by 6.7 cm.. Anterior uterine body mass
by 5.9 by 5.4 cm.

Endometrium

Thickness: Difficult to measure but probably approximately 1 cm.

Right ovary

Unable the visualize

Left ovary

Measurements: 3.9 by 2.0 by 3.4 cm. Mildly heterogeneous appearance.

Other findings

No free fluid.
IMPRESSION: 1. Large anterior uterine body mass, probably a large fibroid. This
represents a significant change from the prior exam.
2. Nonvisualization of the right ovary.
3. Given the reported last menstrual. If 20 years ago, the
endometrium is thickened at 1 cm, but without focal abnormality.
4. Prior left ovarian enlargement is no longer apparent, although
the left are ovary is somewhat heterogeneous today. There is
previously a left ovarian mass with posterior acoustic shadowing
suspicious for dermoid.
5. Given the overall unusual constellation of findings, I would
recommend a followup pelvic MRI with and without contrast for
further characterization of the uterus, endometrium, and left ovary,
as well as visualization of the right ovary.

## 2020-11-25 ENCOUNTER — Other Ambulatory Visit: Payer: Self-pay

## 2020-11-25 ENCOUNTER — Ambulatory Visit: Payer: Medicare Other | Admitting: Podiatry

## 2020-11-25 ENCOUNTER — Encounter: Payer: Self-pay | Admitting: Podiatry

## 2020-11-25 DIAGNOSIS — M2041 Other hammer toe(s) (acquired), right foot: Secondary | ICD-10-CM | POA: Diagnosis not present

## 2020-11-25 DIAGNOSIS — M2042 Other hammer toe(s) (acquired), left foot: Secondary | ICD-10-CM | POA: Diagnosis not present

## 2020-11-25 DIAGNOSIS — E119 Type 2 diabetes mellitus without complications: Secondary | ICD-10-CM | POA: Diagnosis not present

## 2020-11-25 NOTE — Progress Notes (Signed)
Subjective: Kathy Houston presents today referred by Kerin Perna, NP for diabetic foot evaluation.  Patient relates 2013 year history of diabetes.  Patient denies any history of foot wounds.  Patient has history of numbness, tingling, burning, pins/needles sensations.  Past Medical History:  Diagnosis Date   Blood clot in vein    Chronic back pain    Depression    no treatment   Diabetes mellitus    GERD (gastroesophageal reflux disease)    Headache    Hypercholesteremia    Hypertension    Peripheral vascular disease ([facility])    dvt  awhile ago   PONV (postoperative nausea and vomiting)    Ruptured disk    Lower lumbar   Shortness of breath dyspnea    on exertion   Sleep apnea    cpap- need to reset not used in awhile   Vertigo     Patient Active Problem List   Diagnosis Date Noted   LBP (low back pain) 07/17/2012    Past Surgical History:  Procedure Laterality Date   CHOLECYSTECTOMY     DILATATION & CURRETTAGE/HYSTEROSCOPY WITH RESECTOCOPE N/A 03/30/2014   Procedure: DILATATION & CURETTAGE/HYSTEROSCOPY WITH RESECTOCOPE;  Surgeon: Ena Dawley, MD;  Location: [facility];  Service: Gynecology;  Laterality: N/A;   DILATION AND CURETTAGE OF UTERUS     x 5   DILITATION & CURRETTAGE/HYSTROSCOPY WITH NOVASURE ABLATION N/A 03/30/2014   Procedure: DILATATION & CURETTAGE/HYSTEROSCOPY WITH NOVASURE ABLATION;  Surgeon: Ena Dawley, MD;  Location: [facility];  Service: Gynecology;  Laterality: N/A;   MULTIPLE EXTRACTIONS WITH ALVEOLOPLASTY N/A 12/17/2014   Procedure: MULTIPLE EXTRACTIONS OF TEETH 5, 9, 10, 12, 18 AND 20 WITH AVELOPLASTY;  Surgeon: Diona Browner, DDS;  Location: [facility];  Service: Oral Surgery;  Laterality: N/A;    Current Outpatient Medications on File Prior to Visit  Medication Sig Dispense Refill   albuterol (PROVENTIL HFA;VENTOLIN HFA) 108 (90 BASE) MCG/ACT inhaler Inhale 2 puffs into the lungs every 6 (six) hours as needed for wheezing or shortness of  breath.     aspirin EC 81 MG tablet Take 81 mg by mouth daily.     blood glucose meter kit and supplies KIT Dispense based on patient and insurance preference. Use up to four times daily as directed. (FOR ICD-9 250.00, 250.01). 1 each 0   esomeprazole (NEXIUM) 40 MG capsule Take 40 mg by mouth daily before breakfast.     gabapentin (NEURONTIN) 300 MG capsule Take 300 mg by mouth 3 (three) times daily.     HYDROcodone-acetaminophen (NORCO) 5-325 MG tablet Take 1 tablet by mouth every 6 (six) hours as needed for moderate pain. 30 tablet 0   ibuprofen (ADVIL,MOTRIN) 800 MG tablet Take 1 tablet (800 mg total) by mouth 3 (three) times daily. 21 tablet 0   Meclizine HCl (BONINE PO) Take 1 tablet by mouth daily as needed (dizziness). dizziness     metFORMIN (GLUCOPHAGE) 500 MG tablet Take 500 mg by mouth daily with breakfast.      oxyCODONE-acetaminophen (ROXICET) 5-325 MG per tablet Take 1 tablet by mouth every 4 (four) hours as needed for severe pain. 30 tablet 0   pravastatin (PRAVACHOL) 20 MG tablet Take 20 mg by mouth daily.     traMADol (ULTRAM) 50 MG tablet Take 50 mg by mouth every 6 (six) hours as needed for moderate pain or severe pain (pain). For pain     valsartan-hydrochlorothiazide (DIOVAN-HCT) 160-25 MG per tablet Take 1 tablet by mouth daily.  No current facility-administered medications on file prior to visit.     Allergies  Allergen Reactions   Chlorhexidine Itching   Penicillins Itching    Has patient had a PCN reaction causing immediate rash, facial/tongue/throat swelling, SOB or lightheadedness with hypotension: No Has patient had a PCN reaction causing severe rash involving mucus membranes or skin necrosis: No Has patient had a PCN reaction that required hospitalization No Has patient had a PCN reaction occurring within the last 10 years: No If all of the above answers are "NO", then may proceed with Cephalosporin use.     Social History   Occupational History   Not  on file  Tobacco Use   Smoking status: Never   Smokeless tobacco: Never  Substance and Sexual Activity   Alcohol use: No   Drug use: No   Sexual activity: Not on file    History reviewed. No pertinent family history.   There is no immunization history on file for this patient.  Review of systems: Positive Findings in bold print.  Constitutional:  chills, fatigue, fever, sweats, weight change Communication: Optometrist, sign Ecologist, hand writing, iPad/Android device Head: headaches, head injury Eyes: changes in vision, eye pain, glaucoma, cataracts, macular degeneration, diplopia, glare,  light sensitivity, eyeglasses or contacts, blindness Ears nose mouth throat: hearing impaired, hearing aids,  ringing in ears, deaf, sign language,  vertigo, nosebleeds,  rhinitis,  cold sores, snoring, swollen glands Cardiovascular: HTN, edema, arrhythmia, pacemaker in place, defibrillator in place, chest pain/tightness, chronic anticoagulation, blood clot, heart failure, MI Peripheral Vascular: leg cramps, varicose veins, blood clots, lymphedema, varicosities Respiratory:  asthma, difficulty breathing, denies congestion, SOB, wheezing, cough, emphysema Gastrointestinal: change in appetite or weight, abdominal pain, constipation, diarrhea, nausea, vomiting, vomiting blood, change in bowel habits, abdominal pain, jaundice, rectal bleeding, hemorrhoids, GERD Genitourinary:  nocturia,  pain on urination, polyuria,  blood in urine, Foley catheter, urinary urgency, ESRD on hemodialysis Musculoskeletal: amputation, cramping, stiff joints, painful joints, decreased joint motion, fractures, OA, gout, hemiplegia, paraplegia, uses cane, wheelchair bound, uses walker, uses rollator Skin: +changes in toenails, color change, dryness, itching, mole changes,  rash, wound(s) Neurological: headaches, numbness in feet, paresthesias in feet, burning in feet, fainting,  seizures, change in speech, migraines,  memory problems/poor historian, cerebral palsy, weakness, paralysis, CVA, TIA Endocrine: diabetes, hypothyroidism, hyperthyroidism,  goiter, dry mouth, flushing, heat intolerance, cold intolerance,  excessive thirst, denies polyuria,  nocturia Hematological:  easy bleeding, excessive bleeding, easy bruising, enlarged lymph nodes, on long term blood thinner, history of past transusions Allergy/immunological:  hives, eczema, frequent infections, multiple drug allergies, seasonal allergies, transplant recipient, multiple food allergies Psychiatric:  anxiety, depression, mood disorder, suicidal ideations, hallucinations, insomnia  Objective: There were no vitals filed for this visit. Vascular Examination: Capillary refill time less than 3 seconds x 10 digits.  Dorsalis pedis pulses palpable 2 out of 4.  Posterior tibial pulses palpable 2 out of 4.  Digital hair not present x 10 digits.  Skin temperature gradient WNL b/l.  Dermatological Examination: Skin with normal turgor, texture and tone b/l  Toenails 1-5 b/l discolored, thick, dystrophic with subungual debris and pain with palpation to nailbeds due to thickness of nails.  Musculoskeletal: Muscle strength 5/5 to all LE muscle groups.  Hammertoe contractures noted digits 2 through 5 bilaterally  Neurological: Sensation diminished with 10 gram monofilament.  Vibratory sensation diminished  Assessment: NIDDM Encounter for diabetic foot examination  Plan: Discussed diabetic foot care principles. Literature dispensed on today. Patient to continue soft,  supportive shoe gear daily. Patient to report any pedal injuries to medical professional immediately. Follow up one year. Patient/POA to call should there be a concern in the interim. Given that patient has hammertoe contractures present in the setting of diminished neurological sensation I believe she will benefit from diabetic shoes.  She will be scheduled with the nursing to get  diabetic shoes.

## 2020-12-02 ENCOUNTER — Ambulatory Visit: Payer: Medicare Other

## 2020-12-02 ENCOUNTER — Other Ambulatory Visit: Payer: Self-pay

## 2020-12-02 DIAGNOSIS — E119 Type 2 diabetes mellitus without complications: Secondary | ICD-10-CM

## 2020-12-02 NOTE — Progress Notes (Signed)
SITUATION Reason for Consult: Evaluation for Prefabricated Diabetic Shoes and Bilateral Custom Diabetic Inserts. Patient / Caregiver Report: Patient is ready for new diabetic shoes  OBJECTIVE DATA: Patient History / Diagnosis: Diabetes Mellitus with complications  Presence of Diabetic Complications: - Peripheral Neuropathy  Current or Previous Devices: Did not bring  In-Person Foot Examination:  Skin presentation:   Thin, Shiny, Hairless Nail presentation:   Thick, Ingrown, With Fungus Ulcers & Callousing:   None and no history  Toe / Foot Deformities:   - Pes Planus / Cavus - Hindfoot Varus / Valgus - Forefoot ABduction / ADduction  - Hammertoes - Crossover toes - Midfoot collapse - Charcot Deformity   Sensation:    Compromised bilateral plantar surface  Shoe Size: 9.5W  ORTHOTIC RECOMMENDATION Recommended Devices: - 1x pair prefabricated PDAC approved diabetic shoes: APEX A400W size 9.5W or Orthofeet 968 alternate - 3x pair custom-to-patient direct CAM carved diabetic insoles.   GOALS OF SHOES AND INSOLES - Reduce shear and pressure - Reduce / Prevent callus formation - Reduce / Prevent ulceration - Protect the fragile healing compromised diabetic foot.  Patient would benefit from diabetic shoes and inserts as patient has diabetes mellitus and the patient has one or more of the following conditions: - History of partial or complete amputation of the foot - History of previous foot ulceration. - History of pre-ulcerative callus - Peripheral neuropathy with evidence of callus formation - Foot deformity - Poor circulation  ACTIONS PERFORMED Patient was casted for insoles via crush box and measured for shoes via brannock device. Procedure was explained and patient tolerated procedure well. All questions were answered and concerns addressed.  PLAN Insurance to be verified and out of pocket cost communicated to patient. Once cost verified and agreed upon and diabetic  certification received, casts are to be sent to [facility] for fabrication. Patient is to be called for fitting when devices are ready.

## 2021-01-06 ENCOUNTER — Ambulatory Visit: Payer: Medicare Other

## 2021-01-10 ENCOUNTER — Other Ambulatory Visit: Payer: Self-pay

## 2021-01-10 ENCOUNTER — Ambulatory Visit: Payer: Medicare Other

## 2021-01-10 DIAGNOSIS — E119 Type 2 diabetes mellitus without complications: Secondary | ICD-10-CM

## 2021-01-10 DIAGNOSIS — M2042 Other hammer toe(s) (acquired), left foot: Secondary | ICD-10-CM | POA: Diagnosis not present

## 2021-01-10 DIAGNOSIS — M2041 Other hammer toe(s) (acquired), right foot: Secondary | ICD-10-CM

## 2021-01-10 NOTE — Progress Notes (Signed)
SITUATION Reason for Visit: Fitting of Diabetic Shoes & Insoles Patient / Caregiver Report:  Patient reports comfort and is satisfied  OBJECTIVE DATA: Patient History / Diagnosis:     ICD-10-CM   1. Encounter for diabetic foot exam ([place])  E11.9     2. [place], bilateral  M20.41    M20.42       Change in Status:   None  ACTIONS PERFORMED: In-Person Delivery, patient was fit with: - 1x pair [id number] PDAC approved prefabricated Diabetic Shoes: Orthofeet Coral - 3x pair [id number] PDAC approved CAM milled custom diabetic insoles  Shoes and insoles were verified for structural integrity and safety. Patient wore shoes and insoles in office. Skin was inspected and free of areas of concern after wearing shoes and inserts. Shoes and inserts fit properly. Patient / Caregiver provided with ferbal instruction and demonstration regarding donning, doffing, wear, care, proper fit, function, purpose, cleaning, and use of shoes and insoles ' and in all related precautions and risks and benefits regarding shoes and insoles. Patient / Caregiver was instructed to wear properly fitting socks with shoes at all times. Patient was also provided with verbal instruction regarding how to report any failures or malfunctions of shoes or inserts, and necessary follow up care. Patient / Caregiver was also instructed to contact physician regarding change in status that may affect function of shoes and inserts.   Patient / Caregiver verbalized undersatnding of instruction provided. Patient / Caregiver demonstrated independence with proper donning and doffing of shoes and inserts.  PLAN Patient to follow up as needed. Plan of care was discussed with and agreed upon by patient and/or caregiver. All questions were answered and concerns addressed.

## 2021-01-25 ENCOUNTER — Encounter: Payer: Self-pay | Admitting: Obstetrics and Gynecology

## 2021-04-06 ENCOUNTER — Telehealth (HOSPITAL_COMMUNITY): Payer: Self-pay

## 2021-04-06 ENCOUNTER — Ambulatory Visit (HOSPITAL_COMMUNITY)
Admission: EM | Admit: 2021-04-06 | Discharge: 2021-04-06 | Disposition: A | Discharge status: Home / Self Care | Payer: Medicare (Managed Care) | Attending: Family Medicine | Admitting: Family Medicine

## 2021-04-06 ENCOUNTER — Encounter (HOSPITAL_COMMUNITY): Payer: Self-pay

## 2021-04-06 DIAGNOSIS — R112 Nausea with vomiting, unspecified: Secondary | ICD-10-CM | POA: Diagnosis not present

## 2021-04-06 DIAGNOSIS — T50905A Adverse effect of unspecified drugs, medicaments and biological substances, initial encounter: Secondary | ICD-10-CM | POA: Diagnosis not present

## 2021-04-06 LAB — CBG MONITORING, ED: Glucose-Capillary: 163 mg/dL — ABNORMAL HIGH (ref 70–99)

## 2021-04-06 MED ORDER — ONDANSETRON 4 MG PO TBDP: 4.0000 mg | ORAL_TABLET | Freq: Three times a day (TID) | ORAL | 0 refills | Status: AC | PRN

## 2021-04-06 MED ORDER — ONDANSETRON 4 MG PO TBDP
4.0000 mg | ORAL_TABLET | Freq: Once | ORAL | Status: AC
  Administered 2021-04-06: 4 mg via ORAL

## 2021-04-06 MED ORDER — ONDANSETRON 4 MG PO TBDP
ORAL_TABLET | ORAL | Status: AC
  Filled 2021-04-06: qty 1

## 2021-04-06 MED ORDER — ONDANSETRON 4 MG PO TBDP: 4.0000 mg | ORAL_TABLET | Freq: Three times a day (TID) | ORAL | 0 refills | Status: DC | PRN

## 2021-04-06 NOTE — ED Provider Notes (Signed)
?[facility] ? ? ? ?CSN: [id number] ?Arrival date & time: 04/06/21  1842 ? ? ?  ? ?History   ?Chief Complaint ?Chief Complaint  ?Patient presents with  ? Shaking  ? ? ?HPI ?Kathy Houston is a 61 y.o. female.  ? ?The patient is a 61 year old female who presents with nausea, vomiting and headache.  Patient states that this morning she took Victoza 18 units.  Shortly after, she developed nausea, vomiting, and bloating.  She also states that she has been burping.  She states over the course of the day she has tried to eat, but has vomited approximately 4-5 times.  She was off of the Victoza, but restarted it today.  Patient has a history of type 1 diabetes.  States that she tried to call her primary care physician this afternoon, but she has not received a call back.  She denies fever, chills, abdominal pain, chest pain, shortness of breath, or difficulty breathing. ? ? ? ?Past Medical History:  ?Diagnosis Date  ? Blood clot in vein   ? Chronic back pain   ? Depression   ? no treatment  ? Diabetes mellitus   ? GERD (gastroesophageal reflux disease)   ? Headache   ? Hypercholesteremia   ? Hypertension   ? Peripheral vascular disease ([place])   ? dvt  awhile ago  ? PONV (postoperative nausea and vomiting)   ? Ruptured disk   ? Lower lumbar  ? Shortness of breath dyspnea   ? on exertion  ? Sleep apnea   ? cpap- need to reset not used in awhile  ? Vertigo   ? ? ?Patient Active Problem List  ? Diagnosis Date Noted  ? LBP (low back pain) 07/17/2012  ? ? ?Past Surgical History:  ?Procedure Laterality Date  ? CHOLECYSTECTOMY    ? DILATATION & CURRETTAGE/HYSTEROSCOPY WITH RESECTOCOPE N/A 03/30/2014  ? Procedure: [place];  Surgeon: Ena Dawley, MD;  Location: [facility];  Service: Gynecology;  Laterality: N/A;  ? DILATION AND CURETTAGE OF UTERUS    ? x 5  ? DILITATION & CURRETTAGE/HYSTROSCOPY WITH NOVASURE ABLATION N/A 03/30/2014  ? Procedure: DILATATION & CURETTAGE/HYSTEROSCOPY  WITH NOVASURE ABLATION;  Surgeon: Ena Dawley, MD;  Location: [facility];  Service: Gynecology;  Laterality: N/A;  ? MULTIPLE EXTRACTIONS WITH ALVEOLOPLASTY N/A 12/17/2014  ? Procedure: MULTIPLE EXTRACTIONS OF TEETH 5, 9, 10, 12, 18 AND 20 WITH AVELOPLASTY;  Surgeon: Diona Browner, DDS;  Location: [facility];  Service: Oral Surgery;  Laterality: N/A;  ? ? ?OB History   ?No obstetric history on file. ?  ? ? ? ?Home Medications   ? ?Prior to Admission medications   ?Medication Sig Start Date End Date Taking? Authorizing Provider  ?ondansetron (ZOFRAN-ODT) 4 MG disintegrating tablet Take 1 tablet (4 mg total) by mouth every 8 (eight) hours as needed for up to 5 days for nausea or vomiting. 04/06/21 04/11/21 Yes Faatimah Spielberg-Warren, Alda Lea, NP  ?albuterol (PROVENTIL HFA;VENTOLIN HFA) 108 (90 BASE) MCG/ACT inhaler Inhale 2 puffs into the lungs every 6 (six) hours as needed for wheezing or shortness of breath.    [provider]  ?aspirin EC 81 MG tablet Take 81 mg by mouth daily.    [provider]  ?blood glucose meter kit and supplies KIT Dispense based on patient and insurance preference. Use up to four times daily as directed. (FOR ICD-9 250.00, 250.01). 01/25/16   Shary Decamp, PA-C  ?esomeprazole (NEXIUM) 40 MG capsule Take 40  mg by mouth daily before breakfast.    [provider]  ?gabapentin (NEURONTIN) 300 MG capsule Take 300 mg by mouth 3 (three) times daily.    [provider]  ?HYDROcodone-acetaminophen (NORCO) 5-325 MG tablet Take 1 tablet by mouth every 6 (six) hours as needed for moderate pain. 12/17/14   Diona Browner, DMD  ?ibuprofen (ADVIL,MOTRIN) 800 MG tablet Take 1 tablet (800 mg total) by mouth 3 (three) times daily. 01/30/13   Rancour, Annie Main, MD  ?Meclizine HCl (BONINE PO) Take 1 tablet by mouth daily as needed (dizziness). dizziness    [provider]  ?metFORMIN (GLUCOPHAGE) 500 MG tablet Take 500 mg by mouth daily with breakfast.     [provider]   ?oxyCODONE-acetaminophen (ROXICET) 5-325 MG per tablet Take 1 tablet by mouth every 4 (four) hours as needed for severe pain. 03/30/14   Ena Dawley, MD  ?pravastatin (PRAVACHOL) 20 MG tablet Take 20 mg by mouth daily.    [provider]  ?traMADol (ULTRAM) 50 MG tablet Take 50 mg by mouth every 6 (six) hours as needed for moderate pain or severe pain (pain). For pain    [provider]  ?valsartan-hydrochlorothiazide (DIOVAN-HCT) 160-25 MG per tablet Take 1 tablet by mouth daily.    [provider]  ? ? ?Family History ?History reviewed. No pertinent family history. ? ?Social History ?Social History  ? ?Tobacco Use  ? Smoking status: Never  ? Smokeless tobacco: Never  ?Substance Use Topics  ? Alcohol use: No  ? Drug use: No  ? ? ? ?Allergies   ?Chlorhexidine and Penicillins ? ? ?Review of Systems ?Review of Systems  ?Constitutional:  Positive for fatigue.  ?Respiratory: Negative.    ?Cardiovascular: Negative.   ?Gastrointestinal:  Positive for abdominal distention, nausea and vomiting.  ?Skin: Negative.   ?Neurological:  Positive for headaches.  ?Psychiatric/Behavioral: Negative.    ? ? ?Physical Exam ?Triage Vital Signs ?ED Triage Vitals  ?Enc Vitals Group  ?   BP 04/06/21 1910 118/82  ?   Pulse Rate 04/06/21 1910 89  ?   Resp 04/06/21 1910 17  ?   Temp --   ?   Temp src --   ?   SpO2 04/06/21 1910 96 %  ?   Weight --   ?   Height --   ?   Head Circumference --   ?   Peak Flow --   ?   Pain Score 04/06/21 1909 4  ?   Pain Loc --   ?   Pain Edu? --   ?   Excl. in [place]? --   ? ?No data found. ? ?Updated Vital Signs ?BP 118/82 (BP Location: Left Arm)   Pulse 89   Resp 17   LMP 01/03/2014   SpO2 96%  ? ?Visual Acuity ?Right Eye Distance:   ?Left Eye Distance:   ?Bilateral Distance:   ? ?Right Eye Near:   ?Left Eye Near:    ?Bilateral Near:    ? ?Physical Exam ?Vitals reviewed.  ?Constitutional:   ?   General: She is not in acute distress. ?   Appearance: Normal appearance.  ?HENT:  ?    Head: Normocephalic and atraumatic.  ?Eyes:  ?   Extraocular Movements: Extraocular movements intact.  ?   Conjunctiva/sclera: Conjunctivae normal.  ?   Pupils: Pupils are equal, round, and reactive to light.  ?Cardiovascular:  ?   Rate and Rhythm: Normal rate and regular rhythm.  ?  Pulses: Normal pulses.  ?   Heart sounds: Normal heart sounds.  ?Pulmonary:  ?   Effort: Pulmonary effort is normal.  ?   Breath sounds: Normal breath sounds.  ?Abdominal:  ?   General: Bowel sounds are normal. There is distension.  ?   Tenderness: There is no abdominal tenderness. There is no right CVA tenderness, left CVA tenderness or guarding.  ?Musculoskeletal:  ?   Cervical back: Normal range of motion and neck supple.  ?Skin: ?   General: Skin is warm and dry.  ?Neurological:  ?   General: No focal deficit present.  ?   Mental Status: She is alert and oriented to person, place, and time.  ?Psychiatric:     ?   Mood and Affect: Mood normal.     ?   Behavior: Behavior normal.  ? ? ? ?UC Treatments / Results  ?Labs ?(all labs ordered are listed, but only abnormal results are displayed) ?Labs Reviewed  ?CBG MONITORING, ED - Abnormal; Notable for the following components:  ?    Result Value  ? Glucose-Capillary 163 (*)   ? All other components within normal limits  ? ? ?EKG ? ? ?Radiology ?No results found. ? ?Procedures ?Procedures (including critical care time) ? ?Medications Ordered in UC ?Medications  ?ondansetron (ZOFRAN-ODT) disintegrating tablet 4 mg (4 mg Oral Given 04/06/21 1938)  ? ? ?Initial Impression / Assessment and Plan / UC Course  ?I have reviewed the triage vital signs and the nursing notes. ? ?Pertinent labs & imaging results that were available during my care of the patient were reviewed by me and considered in my medical decision making (see chart for details). ? ?The patient is a 61 year old female who presents with nausea, vomiting, and headache.  Patient states that she increase her Victoza this morning up to  18 units.  States she has not taken the medication in a while but it was recently restarted.  Shortly after she took the medication, she developed nausea and vomiting and headache.  She states symptoms have bee

## 2021-04-06 NOTE — ED Triage Notes (Signed)
Pt presents with c/o stomach pain, dizziness and headaches.  ?Pt states this started after taking her medications that were changed. States when she eats she feels nauseas.  ?

## 2021-04-06 NOTE — Discharge Instructions (Addendum)
Take medication as prescribed. ?As discussed, a bland diet until symptoms improved to include soups, broths, rice, applesauce or toast.  Also start with ice chips, increase to ice water, or sips of diet ginger ale or Diet Coke. ?Follow-up with your primary care within the next 1 to 2 days to discuss medication dosing. ?Follow-up as needed. ?

## 2021-04-19 ENCOUNTER — Other Ambulatory Visit: Payer: Self-pay | Admitting: Nephrology

## 2021-04-19 DIAGNOSIS — I10 Essential (primary) hypertension: Secondary | ICD-10-CM

## 2021-04-19 DIAGNOSIS — N184 Chronic kidney disease, stage 4 (severe): Secondary | ICD-10-CM

## 2021-04-19 DIAGNOSIS — E559 Vitamin D deficiency, unspecified: Secondary | ICD-10-CM

## 2021-04-24 ENCOUNTER — Other Ambulatory Visit: Payer: BLUE CROSS/BLUE SHIELD

## 2021-04-26 ENCOUNTER — Ambulatory Visit
Admission: RE | Admit: 2021-04-26 | Discharge: 2021-04-26 | Disposition: A | Discharge status: Home / Self Care | Payer: Medicare (Managed Care) | Source: Ambulatory Visit | Attending: Nephrology | Admitting: Nephrology

## 2021-04-26 DIAGNOSIS — I10 Essential (primary) hypertension: Secondary | ICD-10-CM

## 2021-04-26 DIAGNOSIS — N184 Chronic kidney disease, stage 4 (severe): Secondary | ICD-10-CM

## 2021-04-26 DIAGNOSIS — E559 Vitamin D deficiency, unspecified: Secondary | ICD-10-CM

## 2021-05-09 NOTE — Telephone Encounter (Signed)
error 

## 2022-03-01 ENCOUNTER — Other Ambulatory Visit: Payer: Self-pay | Admitting: Obstetrics and Gynecology

## 2022-03-01 DIAGNOSIS — Z1239 Encounter for other screening for malignant neoplasm of breast: Secondary | ICD-10-CM

## 2022-08-16 ENCOUNTER — Encounter: Payer: Self-pay | Admitting: Cardiology

## 2022-08-16 DIAGNOSIS — M79602 Pain in left arm: Secondary | ICD-10-CM | POA: Insufficient documentation

## 2022-08-16 NOTE — Progress Notes (Deleted)
Cardiology Office Note   Date:  08/17/2022   ID:  Kathy Houston, DOB 1961-01-14, MRN [id number]  PCP:  Salli Real, MD  Cardiologist:   None Referring:  ***  No chief complaint on file.     History of Present Illness: Kathy Houston is a 62 y.o. female who presents for evaluation of left arm pain.  ***     Past Medical History:  Diagnosis Date   Blood clot in vein    Chronic back pain    Depression    no treatment   Diabetes mellitus    GERD (gastroesophageal reflux disease)    Hypercholesteremia    Hypertension    Peripheral vascular disease ([facility])    dvt  awhile ago   PONV (postoperative nausea and vomiting)    Ruptured disk    Lower lumbar   Sleep apnea    cpap- need to reset not used in awhile   Vertigo     Past Surgical History:  Procedure Laterality Date   CHOLECYSTECTOMY     DILATATION & CURRETTAGE/HYSTEROSCOPY WITH RESECTOCOPE N/A 03/30/2014   Procedure: DILATATION & CURETTAGE/HYSTEROSCOPY WITH RESECTOCOPE;  Surgeon: Kirkland Hun, MD;  Location: [facility];  Service: Gynecology;  Laterality: N/A;   DILATION AND CURETTAGE OF UTERUS     x 5   DILITATION & CURRETTAGE/HYSTROSCOPY WITH NOVASURE ABLATION N/A 03/30/2014   Procedure: DILATATION & CURETTAGE/HYSTEROSCOPY WITH NOVASURE ABLATION;  Surgeon: Kirkland Hun, MD;  Location: [facility];  Service: Gynecology;  Laterality: N/A;   MULTIPLE EXTRACTIONS WITH ALVEOLOPLASTY N/A 12/17/2014   Procedure: MULTIPLE EXTRACTIONS OF TEETH 5, 9, 10, 12, 18 AND 20 WITH AVELOPLASTY;  Surgeon: Ocie Doyne, DDS;  Location: [facility];  Service: Oral Surgery;  Laterality: N/A;     Current Outpatient Medications  Medication Sig Dispense Refill   albuterol (PROVENTIL HFA;VENTOLIN HFA) 108 (90 BASE) MCG/ACT inhaler Inhale 2 puffs into the lungs every 6 (six) hours as needed for wheezing or shortness of breath.     aspirin EC 81 MG tablet Take 81 mg by mouth daily.     blood glucose meter kit and supplies KIT Dispense based on patient and  insurance preference. Use up to four times daily as directed. (FOR ICD-9 250.00, 250.01). 1 each 0   esomeprazole (NEXIUM) 40 MG capsule Take 40 mg by mouth daily before breakfast.     gabapentin (NEURONTIN) 300 MG capsule Take 300 mg by mouth 3 (three) times daily.     HYDROcodone-acetaminophen (NORCO) 5-325 MG tablet Take 1 tablet by mouth every 6 (six) hours as needed for moderate pain. 30 tablet 0   ibuprofen (ADVIL,MOTRIN) 800 MG tablet Take 1 tablet (800 mg total) by mouth 3 (three) times daily. 21 tablet 0   Meclizine HCl (BONINE PO) Take 1 tablet by mouth daily as needed (dizziness). dizziness     metFORMIN (GLUCOPHAGE) 500 MG tablet Take 500 mg by mouth daily with breakfast.      oxyCODONE-acetaminophen (ROXICET) 5-325 MG per tablet Take 1 tablet by mouth every 4 (four) hours as needed for severe pain. 30 tablet 0   pravastatin (PRAVACHOL) 20 MG tablet Take 20 mg by mouth daily.     traMADol (ULTRAM) 50 MG tablet Take 50 mg by mouth every 6 (six) hours as needed for moderate pain or severe pain (pain). For pain     valsartan-hydrochlorothiazide (DIOVAN-HCT) 160-25 MG per tablet Take 1 tablet by mouth daily.     No current facility-administered medications for this visit.  Allergies:   Chlorhexidine and Penicillins    Social History:  The patient  reports that she has never smoked. She has never used smokeless tobacco. She reports that she does not drink alcohol and does not use drugs.   Family History:  The patient's ***family history is not on file.    ROS:  Please see the history of present illness.   Otherwise, review of systems are positive for {NONE DEFAULTED:18576}.   All other systems are reviewed and negative.    PHYSICAL EXAM: VS:  LMP 01/03/2014  , BMI There is no height or weight on file to calculate BMI. GENERAL:  Well appearing HEENT:  Pupils equal round and reactive, fundi not visualized, oral mucosa unremarkable NECK:  No jugular venous distention, waveform  within normal limits, carotid upstroke brisk and symmetric, no bruits, no thyromegaly LYMPHATICS:  No cervical, inguinal adenopathy LUNGS:  Clear to auscultation bilaterally BACK:  No CVA tenderness CHEST:  Unremarkable HEART:  PMI not displaced or sustained,S1 and S2 within normal limits, no S3, no S4, no clicks, no rubs, *** murmurs ABD:  Flat, positive bowel sounds normal in frequency in pitch, no bruits, no rebound, no guarding, no midline pulsatile mass, no hepatomegaly, no splenomegaly EXT:  2 plus pulses throughout, no edema, no cyanosis no clubbing SKIN:  No rashes no nodules NEURO:  Cranial nerves II through XII grossly intact, motor grossly intact throughout PSYCH:  Cognitively intact, oriented to person place and time    EKG:        Recent Labs: No results found for requested labs within last 365 days.    Lipid Panel No results found for: "CHOL", "TRIG", "HDL", "CHOLHDL", "VLDL", "LDLCALC", "LDLDIRECT"    Wt Readings from Last 3 Encounters:  01/25/16 284 lb (128.8 kg)  10/07/15 284 lb (128.8 kg)  12/17/14 284 lb (128.8 kg)      Other studies Reviewed: Additional studies/ records that were reviewed today include: ***. Review of the above records demonstrates:  Please see elsewhere in the note.  ***   ASSESSMENT AND PLAN:  Left arm pain:  ***     Current medicines are reviewed at length with the patient today.  The patient {ACTIONS; HAS/DOES NOT HAVE:19233} concerns regarding medicines.  The following changes have been made:  {PLAN; NO CHANGE:13088:s}  Labs/ tests ordered today include: *** No orders of the defined types were placed in this encounter.    Disposition:   FU with ***    Signed, Rollene Rotunda, MD  08/17/2022 2:46 PM    [facility]

## 2022-08-17 ENCOUNTER — Ambulatory Visit: Payer: BLUE CROSS/BLUE SHIELD | Attending: Cardiology | Admitting: Cardiology

## 2022-08-17 ENCOUNTER — Encounter: Payer: Self-pay | Admitting: Cardiology

## 2022-08-17 DIAGNOSIS — M79602 Pain in left arm: Secondary | ICD-10-CM

## 2022-10-25 NOTE — Progress Notes (Signed)
Cardiology Office Note   Date:  10/26/2022   ID:  Kathy Houston, DOB 1960/09/11, MRN [id number]  PCP:  Salli Real, MD  Cardiologist:   None Referring:  Salli Real, MD  Chief Complaint  Patient presents with   Cardiomyopathy      History of Present Illness: Kathy Houston is a 62 y.o. female who presents for evaluation of chest pain.  The patient has had no past cardiac history.  She does have cardiovascular risk factors.  She is describing left-sided upper chest discomfort.  This has been going on for some time and happens sporadically.  It can happen at rest when she is sitting.  She notices it at times lying down and in particular if she is lying on her left side.  It can be 8 out of 10 in intensity.  It is left upper chest.  It does not radiate to her jaw or to her arms.  It is not exacerbated by inspiration.  She has had no cough fevers or chills.  She has not had this kind of discomfort before.  She does not have associated nausea vomiting or diaphoresis.  She says her "feet get clammy."  She has not taken anything to relieve this.  It goes away after several minutes.  She walks slowly because of the knee pain and because of the DVT she had in the distant past.  She actually has a brace on her knee and has to use a walker.  She does go through the grocery store and carries in her groceries however.      Past Medical History:  Diagnosis Date   Blood clot in vein    Chronic back pain    Depression    no treatment   Diabetes mellitus    GERD (gastroesophageal reflux disease)    Hypercholesteremia    Hypertension    Peripheral vascular disease (HCC)    dvt  awhile ago   PONV (postoperative nausea and vomiting)    Ruptured disk    Lower lumbar   Sleep apnea    cpap- need to reset not used in awhile   Vertigo     Past Surgical History:  Procedure Laterality Date   CHOLECYSTECTOMY     DILATATION & CURRETTAGE/HYSTEROSCOPY WITH RESECTOCOPE N/A 03/30/2014   Procedure:  DILATATION & CURETTAGE/HYSTEROSCOPY WITH RESECTOCOPE;  Surgeon: Kirkland Hun, MD;  Location: [facility];  Service: Gynecology;  Laterality: N/A;   DILATION AND CURETTAGE OF UTERUS     x 5   DILITATION & CURRETTAGE/HYSTROSCOPY WITH NOVASURE ABLATION N/A 03/30/2014   Procedure: DILATATION & CURETTAGE/HYSTEROSCOPY WITH NOVASURE ABLATION;  Surgeon: Kirkland Hun, MD;  Location: [facility];  Service: Gynecology;  Laterality: N/A;   MULTIPLE EXTRACTIONS WITH ALVEOLOPLASTY N/A 12/17/2014   Procedure: MULTIPLE EXTRACTIONS OF TEETH 5, 9, 10, 12, 18 AND 20 WITH AVELOPLASTY;  Surgeon: Ocie Doyne, DDS;  Location: [facility];  Service: Oral Surgery;  Laterality: N/A;     Current Outpatient Medications  Medication Sig Dispense Refill   allopurinol (ZYLOPRIM) 300 MG tablet Take 600 mg by mouth daily.     aspirin EC 81 MG tablet Take 81 mg by mouth daily.     blood glucose meter kit and supplies KIT Dispense based on patient and insurance preference. Use up to four times daily as directed. (FOR ICD-9 250.00, 250.01). 1 each 0   colchicine 0.6 MG tablet Take 0.6 mg by mouth daily as needed.     esomeprazole (NEXIUM) 40  MG capsule Take 40 mg by mouth daily before breakfast.     FARXIGA 5 MG TABS tablet Take 5 mg by mouth daily.     insulin detemir (LEVEMIR FLEXPEN) 100 UNIT/ML FlexPen Inject into the skin daily.     Meclizine HCl (BONINE PO) Take 1 tablet by mouth daily as needed (dizziness). dizziness     metoCLOPramide (REGLAN) 10 MG tablet Take 10 mg by mouth 4 (four) times daily.     nystatin cream (MYCOSTATIN) Apply 1 Application topically 2 (two) times daily.     NYSTATIN powder Apply 1 Application topically 2 (two) times daily.     oxyCODONE-acetaminophen (PERCOCET) 10-325 MG tablet Take 1 tablet by mouth every 4 (four) hours as needed for pain.     pravastatin (PRAVACHOL) 20 MG tablet Take 20 mg by mouth daily.     valsartan-hydrochlorothiazide (DIOVAN-HCT) 160-25 MG per tablet Take 1 tablet by mouth daily.      albuterol (PROVENTIL HFA;VENTOLIN HFA) 108 (90 BASE) MCG/ACT inhaler Inhale 2 puffs into the lungs every 6 (six) hours as needed for wheezing or shortness of breath. (Patient not taking: Reported on 10/26/2022)     gabapentin (NEURONTIN) 300 MG capsule Take 300 mg by mouth 3 (three) times daily. (Patient not taking: Reported on 10/26/2022)     HYDROcodone-acetaminophen (NORCO) 5-325 MG tablet Take 1 tablet by mouth every 6 (six) hours as needed for moderate pain. (Patient not taking: Reported on 10/26/2022) 30 tablet 0   ibuprofen (ADVIL,MOTRIN) 800 MG tablet Take 1 tablet (800 mg total) by mouth 3 (three) times daily. (Patient not taking: Reported on 10/26/2022) 21 tablet 0   metFORMIN (GLUCOPHAGE) 500 MG tablet Take 500 mg by mouth daily with breakfast.  (Patient not taking: Reported on 10/26/2022)     oxyCODONE-acetaminophen (ROXICET) 5-325 MG per tablet Take 1 tablet by mouth every 4 (four) hours as needed for severe pain. (Patient not taking: Reported on 10/26/2022) 30 tablet 0   traMADol (ULTRAM) 50 MG tablet Take 50 mg by mouth every 6 (six) hours as needed for moderate pain or severe pain (pain). For pain (Patient not taking: Reported on 10/26/2022)     No current facility-administered medications for this visit.    Allergies:   Chlorhexidine and Penicillins    Social History:  The patient  reports that she has never smoked. She has never used smokeless tobacco. She reports that she does not drink alcohol and does not use drugs.   Family History:  The patient's family history includes Heart attack in her father; Heart failure in her brother and father.    ROS:  Please see the history of present illness.   Otherwise, review of systems are positive for none.   All other systems are reviewed and negative.    PHYSICAL EXAM: VS:  BP 100/60 (BP Location: Right Arm, Patient Position: Sitting, Cuff Size: Large)   Pulse 76   Ht 5\' 4"  (1.626 m)   Wt 263 lb 9.6 oz (119.6 kg)   LMP  01/03/2014   BMI 45.25 kg/m  , BMI Body mass index is 45.25 kg/m. GENERAL:  Well appearing HEENT:  Pupils equal round and reactive, fundi not visualized, oral mucosa unremarkable NECK:  No jugular venous distention, waveform within normal limits, carotid upstroke brisk and symmetric, no bruits, no thyromegaly LYMPHATICS:  No cervical, inguinal adenopathy LUNGS:  Clear to auscultation bilaterally BACK:  No CVA tenderness CHEST:  Unremarkable HEART:  PMI not displaced or sustained,S1 and S2 within  normal limits, no S3, no S4, no clicks, no rubs, no murmurs ABD:  Flat, positive bowel sounds normal in frequency in pitch, no bruits, no rebound, no guarding, no midline pulsatile mass, no hepatomegaly, no splenomegaly EXT:  2 plus pulses throughout, no edema, no cyanosis no clubbing SKIN:  No rashes no nodules NEURO:  Cranial nerves II through XII grossly intact, motor grossly intact throughout PSYCH:  Cognitively intact, oriented to person place and time  EKG:      Recent Labs: No results found for requested labs within last 365 days.    Lipid Panel No results found for: "CHOL", "TRIG", "HDL", "CHOLHDL", "VLDL", "LDLCALC", "LDLDIRECT"    Wt Readings from Last 3 Encounters:  10/26/22 263 lb 9.6 oz (119.6 kg)  01/25/16 284 lb (128.8 kg)  10/07/15 284 lb (128.8 kg)      Other studies Reviewed: Additional studies/ records that were reviewed today include: None. Review of the above records demonstrates:  Please see elsewhere in the note.     ASSESSMENT AND PLAN:  Chest pain: Her chest discomfort has anginal and nonanginal features.  She does have cardiovascular risk factors.  She needs to be screened with stress testing for obstructive coronary disease.  She would not be able to walk on a treadmill.  Therefore, she will have PET scanning.  If this is unremarkable then no further cardiac workup.  Sleep apnea: She does have a CPAP that does not work.  She will probably need another  sleep study and needs to be set up to see a sleep specialist.  Dyslipidemia: I will check a lipid profile.  Diabetes mellitus: She says her A1c is 7.0.  This is managed by her primary provider.  Current medicines are reviewed at length with the patient today.  The patient does not have concerns regarding medicines.  The following changes have been made:  no change  Labs/ tests ordered today include:   Orders Placed This Encounter  Procedures   NM PET CT CARDIAC PERFUSION MULTI W/ABSOLUTE BLOODFLOW   Lipid panel   Ambulatory referral to Cardiology   EKG 12-Lead     Disposition:   FU with me based on the results of the above.   Signed, Rollene Rotunda, MD  10/26/2022 2:54 PM    [facility]

## 2022-10-26 ENCOUNTER — Ambulatory Visit: Payer: Medicare Other | Attending: Cardiology | Admitting: Cardiology

## 2022-10-26 ENCOUNTER — Encounter: Payer: Self-pay | Admitting: Cardiology

## 2022-10-26 VITALS — BP 100/60 | HR 76 | Ht 64.0 in | Wt 263.6 lb

## 2022-10-26 DIAGNOSIS — R079 Chest pain, unspecified: Secondary | ICD-10-CM

## 2022-10-26 DIAGNOSIS — G4730 Sleep apnea, unspecified: Secondary | ICD-10-CM | POA: Diagnosis not present

## 2022-10-26 NOTE — Patient Instructions (Signed)
Lab Work:  Your physician recommends that you return for lab work FASTING  If you have labs (blood work) drawn today and your tests are completely normal, you will receive your results only by: MyChart Message (if you have MyChart) OR A paper copy in the mail If you have any lab test that is abnormal or we need to change your treatment, we will call you to review the results.   Testing/Procedures:  How to Prepare for Your Cardiac PET/CT Stress Test:  1. Please take medications before your test with sips of water   2. Nothing to eat or drink, except water, 3 hours prior to arrival time.   NO caffeine/decaffeinated products, or chocolate 12 hours prior to arrival.  3. NO perfume, cologne or lotion on chest or abdomen area.          - FEMALES - Please avoid wearing dresses to this appointment.  4. Total time is 1 to 2 hours; you may want to bring reading material for the waiting time.  5. Please report to Radiology at the [facility] Main Entrance 30 minutes early for your test.  [address] [place], [place] [address]   Diabetic Preparation:  Hold oral medications. You may take NPH and Lantus insulin. Do not take Humalog or Humulin R (Regular Insulin) the day of your test. Check blood sugars prior to leaving the house. If able to eat breakfast prior to 3 hour fasting, you may take all medications, including your insulin, Do not worry if you miss your breakfast dose of insulin - start at your next meal. Patients who wear a continuous glucose monitor MUST remove the device prior to scanning.  IF YOU THINK YOU MAY BE PREGNANT, OR ARE NURSING PLEASE INFORM THE TECHNOLOGIST.  In preparation for your appointment, medication and supplies will be purchased.  Appointment availability is limited, so if you need to cancel or reschedule, please call the Radiology Department at [phone] [facility]) OR [phone] [facility])  24 hours in advance to avoid a cancellation  fee of $100.00  What to Expect After you Arrive:  Once you arrive and check in for your appointment, you will be taken to a preparation room within the Radiology Department.  A technologist or Nurse will obtain your medical history, verify that you are correctly prepped for the exam, and explain the procedure.  Afterwards,  an IV will be started in your arm and electrodes will be placed on your skin for EKG monitoring during the stress portion of the exam. Then you will be escorted to the PET/CT scanner.  There, staff will get you positioned on the scanner and obtain a blood pressure and EKG.  During the exam, you will continue to be connected to the EKG and blood pressure machines.  A small, safe amount of a radioactive tracer will be injected in your IV to obtain a series of pictures of your heart along with an injection of a stress agent.    After your Exam:  It is recommended that you eat a meal and drink a caffeinated beverage to counter act any effects of the stress agent.  Drink plenty of fluids for the remainder of the day and urinate frequently for the first couple of hours after the exam.  Your doctor will inform you of your test results within 7-10 business days.  For more information and frequently asked questions, please visit our website : [email or web address]  For questions about your test or  how to prepare for your test, please call: Cardiac Imaging Nurse Navigators Office: [phone]    Follow-Up: At [facility], you and your health needs are our priority.  As part of our continuing mission to provide you with exceptional heart care, we have created designated Provider Care Teams.  These Care Teams include your primary Cardiologist (physician) and Advanced Practice Providers (APPs -  Physician Assistants and Nurse Practitioners) who all work together to provide you with the care you need, when you need it.  We recommend signing up for the patient portal  called "MyChart".  Sign up information is provided on this After Visit Summary.  MyChart is used to connect with patients for Virtual Visits (Telemedicine).  Patients are able to view lab/test results, encounter notes, upcoming appointments, etc.  Non-urgent messages can be sent to your provider as well.   To learn more about what you can do with MyChart, go to [email or web address].    Your next appointment:   As needed

## 2022-11-26 ENCOUNTER — Telehealth (HOSPITAL_COMMUNITY): Payer: Self-pay | Admitting: *Deleted

## 2022-11-26 NOTE — Telephone Encounter (Signed)
error 

## 2022-11-26 NOTE — Telephone Encounter (Signed)
Reaching out to patient to offer assistance regarding upcoming cardiac imaging study; pt verbalizes understanding of appt date/time, parking situation and where to check in, pre-test NPO status  and verified current allergies; name and call back number provided for further questions should they arise  Shaneeka Scarboro RN Navigator Cardiac Imaging [facility] [phone] office [phone] cell  Patient aware to avoid caffeine 12 hours prior to her cardiac PET scan. 

## 2022-11-28 ENCOUNTER — Ambulatory Visit (HOSPITAL_COMMUNITY)
Admission: RE | Admit: 2022-11-28 | Discharge: 2022-11-28 | Disposition: A | Discharge status: Home / Self Care | Payer: Medicare Other | Source: Ambulatory Visit | Attending: Cardiology | Admitting: Cardiology

## 2022-11-28 DIAGNOSIS — R079 Chest pain, unspecified: Secondary | ICD-10-CM | POA: Diagnosis present

## 2022-11-28 MED ORDER — REGADENOSON 0.4 MG/5ML IV SOLN
0.4000 mg | Freq: Once | INTRAVENOUS | Status: AC
  Administered 2022-11-28: 0.4 mg via INTRAVENOUS

## 2022-11-28 MED ORDER — RUBIDIUM RB82 GENERATOR (RUBYFILL)
29.7500 | PACK | Freq: Once | INTRAVENOUS | Status: AC
  Administered 2022-11-28: 29.75 via INTRAVENOUS

## 2022-11-28 MED ORDER — REGADENOSON 0.4 MG/5ML IV SOLN
INTRAVENOUS | Status: AC
  Filled 2022-11-28: qty 5

## 2022-11-28 MED ORDER — RUBIDIUM RB82 GENERATOR (RUBYFILL)
30.1900 | PACK | Freq: Once | INTRAVENOUS | Status: AC
  Administered 2022-11-28: 30.19 via INTRAVENOUS

## 2022-11-29 LAB — NM PET CT CARDIAC PERFUSION MULTI W/ABSOLUTE BLOODFLOW
MBFR: 2.51
Nuc Rest EF: 66 %
Nuc Stress EF: 74 %
Peak HR: 86 {beats}/min
Rest HR: 73 {beats}/min
Rest MBF: 0.76 ml/g/min
Rest Nuclear Isotope Dose: 30.2 mCi
ST Depression (mm): 0 mm
Stress MBF: 1.91 ml/g/min
Stress Nuclear Isotope Dose: 29.8 mCi
TID: 0.97

## 2022-12-05 ENCOUNTER — Telehealth: Payer: Self-pay | Admitting: Cardiology

## 2022-12-05 NOTE — Telephone Encounter (Signed)
Pt returning nurses call again regarding results. Please advise

## 2022-12-05 NOTE — Telephone Encounter (Signed)
Spoke to patient and below message relayed and results sent to PCP.    Normal LV perfusion.  No evidence of CAD that is limiting blood flow.  There is coronary calcium and she needs risk reduction.  Follow with her primary MD.  Call Ms. Simonetti with the results and send results to Salli Real, MD

## 2022-12-05 NOTE — Telephone Encounter (Signed)
Patient returned RN's call regarding results. 

## 2023-01-11 ENCOUNTER — Ambulatory Visit: Payer: Medicare Other | Admitting: Podiatry

## 2023-01-23 LAB — LAB REPORT - SCANNED: PTH: 71

## 2023-03-04 ENCOUNTER — Other Ambulatory Visit: Payer: Self-pay | Admitting: Obstetrics and Gynecology

## 2023-03-04 DIAGNOSIS — Z1231 Encounter for screening mammogram for malignant neoplasm of breast: Secondary | ICD-10-CM

## 2023-03-25 ENCOUNTER — Ambulatory Visit

## 2023-04-04 ENCOUNTER — Ambulatory Visit
Admission: RE | Admit: 2023-04-04 | Discharge: 2023-04-04 | Disposition: A | Discharge status: Home / Self Care | Source: Ambulatory Visit | Attending: Obstetrics and Gynecology | Admitting: Obstetrics and Gynecology

## 2023-04-04 DIAGNOSIS — Z1231 Encounter for screening mammogram for malignant neoplasm of breast: Secondary | ICD-10-CM

## 2023-08-26 IMAGING — US US RENAL
1 series · 14 of 25 positions shown · non-contrast
Comparison: No prior.

CLINICAL DATA: Chronic renal disease.

EXAM:
RENAL / URINARY TRACT ULTRASOUND COMPLETE

[Series 1: us renal · 0.23mm/px · 14 of 41 slices shown]
[im 1/41]
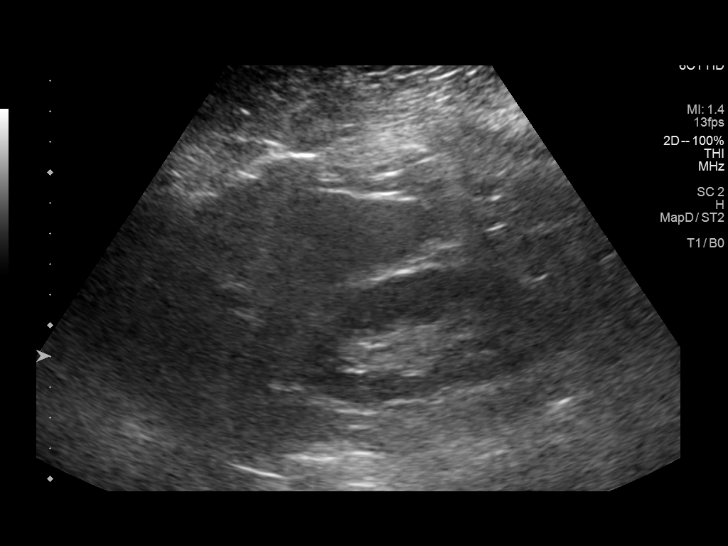
[im 4/41]
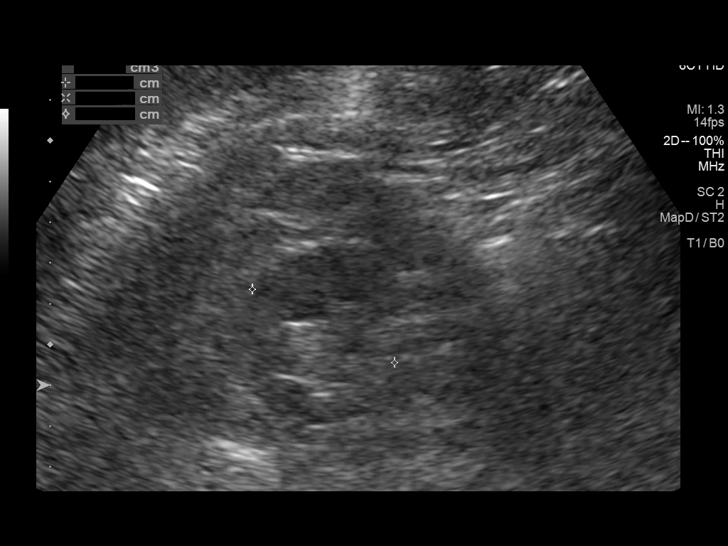
[im 7/41]
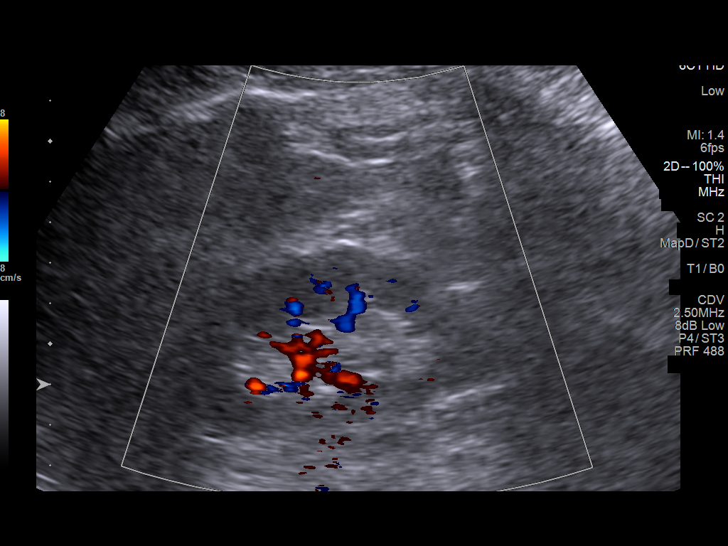
[im 11/41]
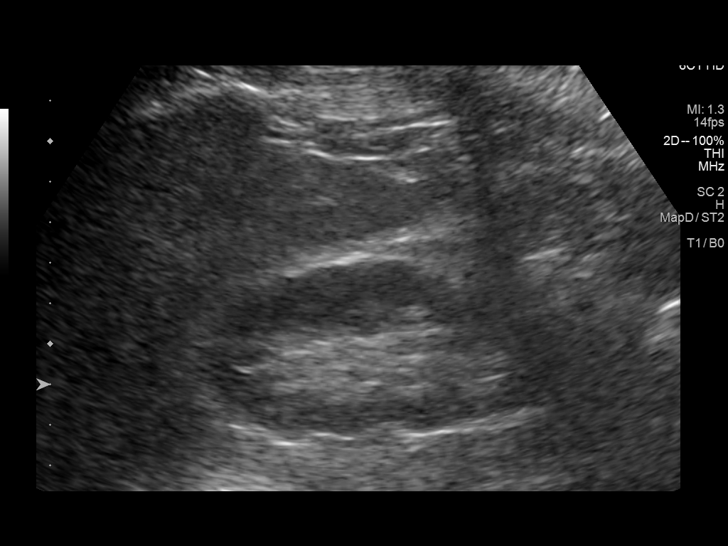
[im 14/41]
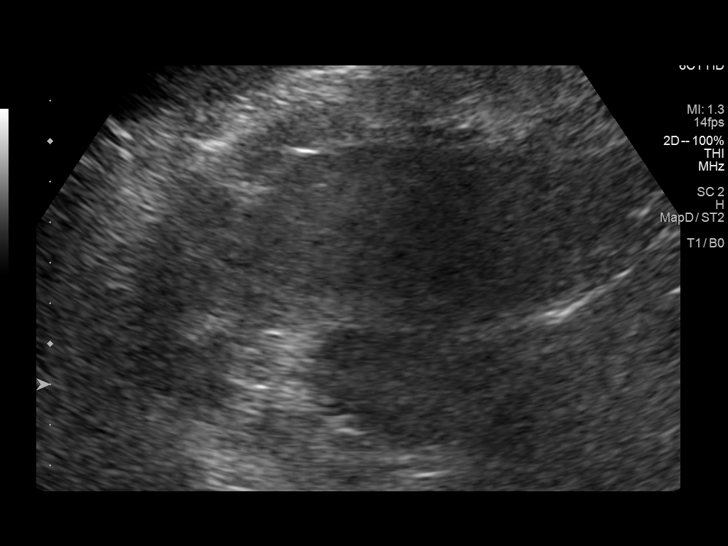
[im 16/41]
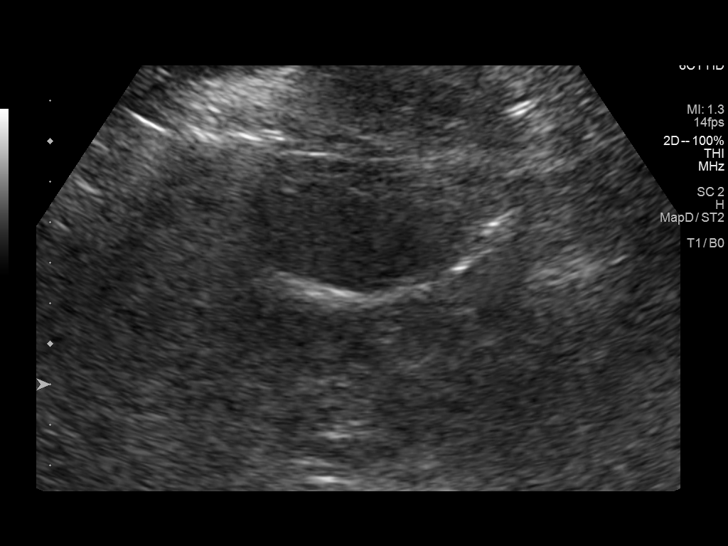
[im 19/41]
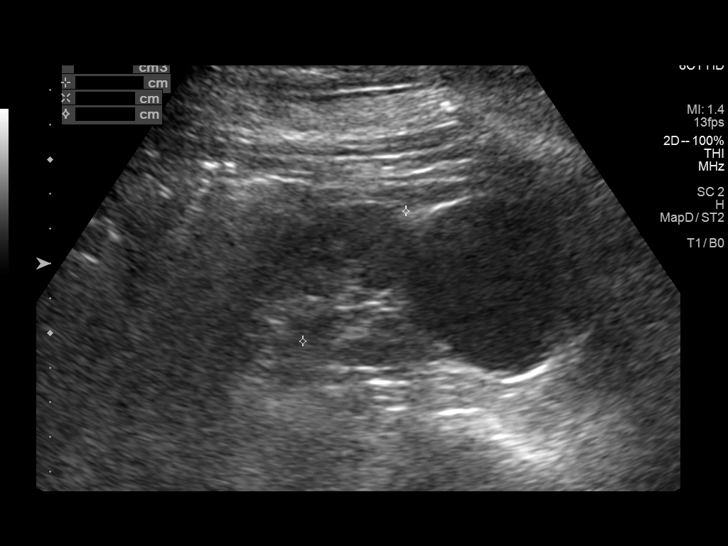
[im 22/41]
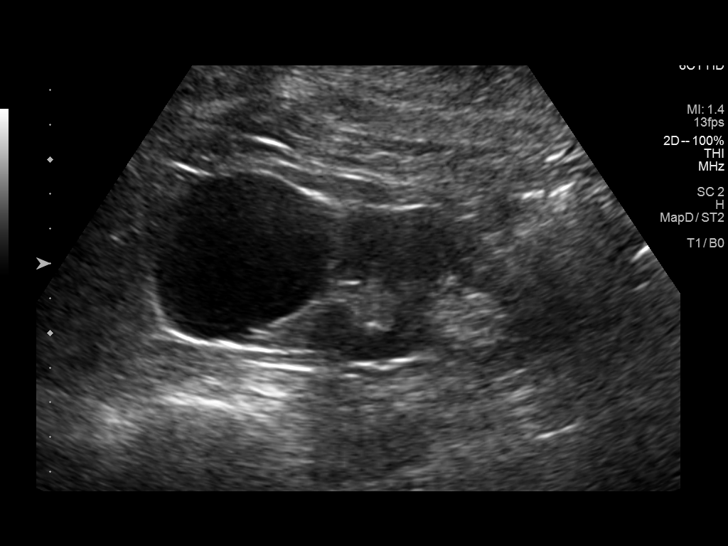
[im 26/41]
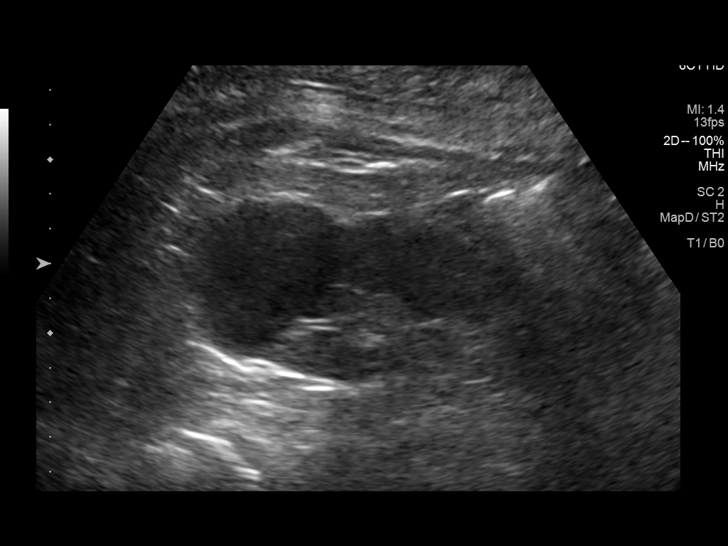
[im 27/41]
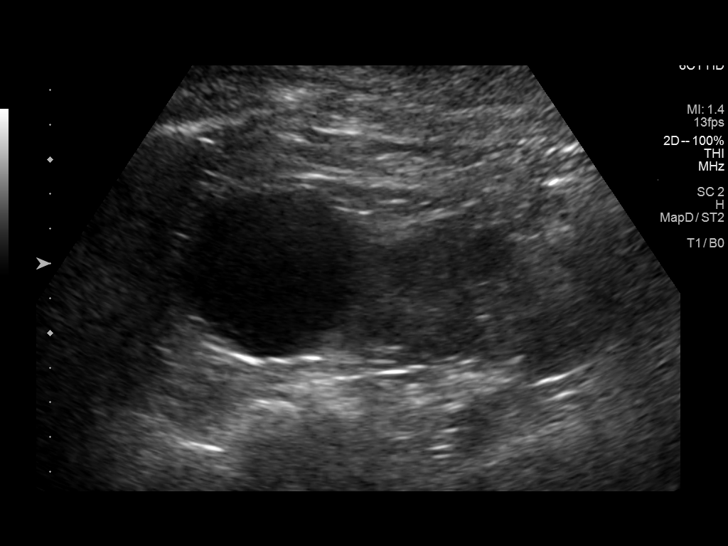
[im 31/41]
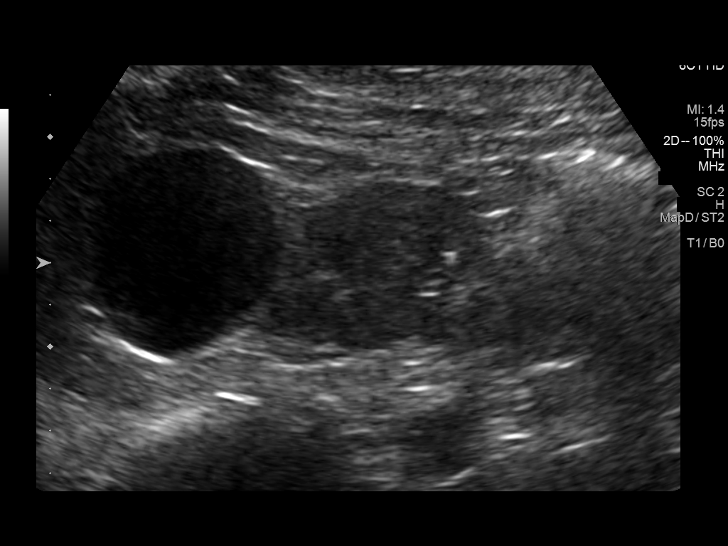
[im 34/41]
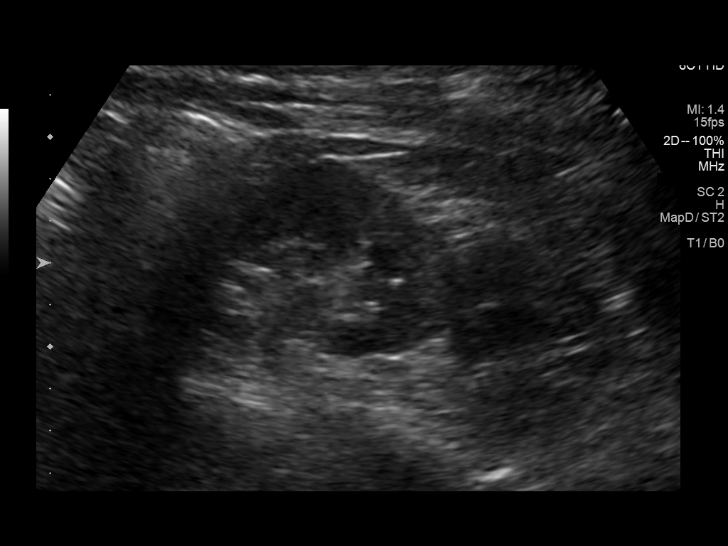
[im 37/41]
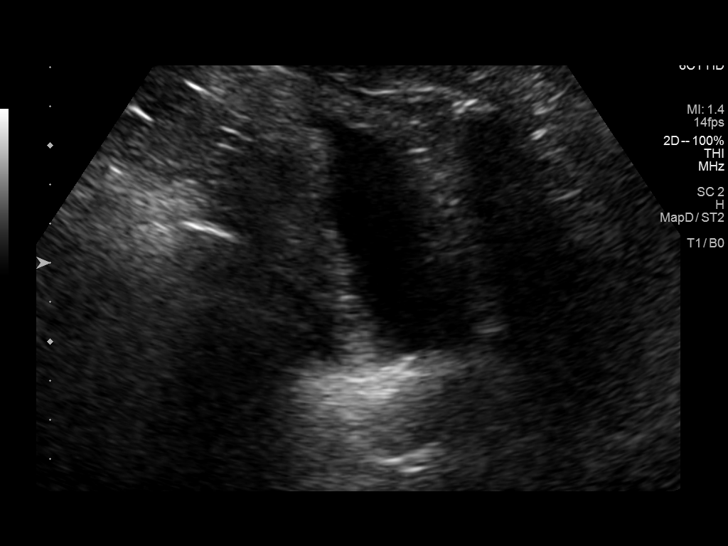
[im 41/41]
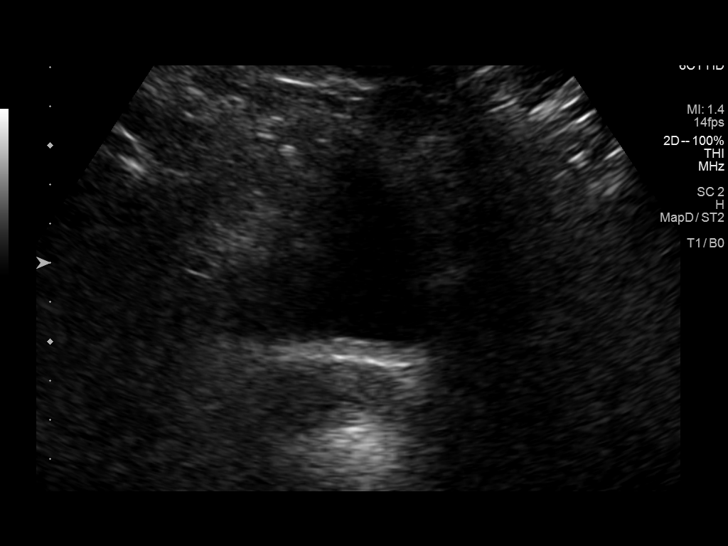

[14 of 25 positions shown; findings below may reference images not displayed]

FINDINGS: Right Kidney:

Renal measurements: 8.9 x 4.1 x 3 point cm = volume: 75.3 mL.
Echogenicity within normal limits. No mass or hydronephrosis
visualized.

Left Kidney:

Renal measurements: 10.0 x 4.9 x 4.8 cm = volume: 124.0 mL.
Echogenicity within normal limits. 5.4 cm simple cyst. No
hydronephrosis visualized.

Bladder:

Appears normal for degree of bladder distention.

Other:

None.
IMPRESSION: 5.4 cm simple cyst left kidney. Exam is otherwise unremarkable. No
hydronephrosis or bladder distention.
